# Patient Record
Sex: Male | Born: 1965
Health system: Southern US, Community
[De-identification: ages and names within clinical notes are randomized; demographics above are authoritative.]

## PROBLEM LIST (undated history)

## (undated) ENCOUNTER — Emergency Department (HOSPITAL_COMMUNITY): Admission: EM | Payer: No Typology Code available for payment source | Source: Home / Self Care

## (undated) DIAGNOSIS — E7800 Pure hypercholesterolemia, unspecified: Secondary | ICD-10-CM

## (undated) DIAGNOSIS — K219 Gastro-esophageal reflux disease without esophagitis: Secondary | ICD-10-CM

## (undated) DIAGNOSIS — I10 Essential (primary) hypertension: Secondary | ICD-10-CM

## (undated) DIAGNOSIS — I213 ST elevation (STEMI) myocardial infarction of unspecified site: Secondary | ICD-10-CM

## (undated) DIAGNOSIS — G4730 Sleep apnea, unspecified: Secondary | ICD-10-CM

## (undated) DIAGNOSIS — E119 Type 2 diabetes mellitus without complications: Secondary | ICD-10-CM

## (undated) DIAGNOSIS — I2510 Atherosclerotic heart disease of native coronary artery without angina pectoris: Secondary | ICD-10-CM

## (undated) DIAGNOSIS — I639 Cerebral infarction, unspecified: Secondary | ICD-10-CM

## (undated) DIAGNOSIS — M1990 Unspecified osteoarthritis, unspecified site: Secondary | ICD-10-CM

## (undated) HISTORY — PX: INGUINAL HERNIA REPAIR: SUR1180

## (undated) HISTORY — PX: KNEE ARTHROSCOPY: SHX127

---

## 2005-11-18 ENCOUNTER — Ambulatory Visit (HOSPITAL_COMMUNITY): Admission: RE | Admit: 2005-11-18 | Discharge: 2005-11-18 | Payer: Self-pay | Admitting: Family Medicine

## 2015-08-11 DIAGNOSIS — I10 Essential (primary) hypertension: Secondary | ICD-10-CM | POA: Diagnosis not present

## 2015-08-11 DIAGNOSIS — E782 Mixed hyperlipidemia: Secondary | ICD-10-CM | POA: Diagnosis not present

## 2015-08-11 DIAGNOSIS — E7800 Pure hypercholesterolemia, unspecified: Secondary | ICD-10-CM | POA: Diagnosis not present

## 2015-08-11 DIAGNOSIS — Z720 Tobacco use: Secondary | ICD-10-CM | POA: Diagnosis not present

## 2015-08-11 DIAGNOSIS — E1165 Type 2 diabetes mellitus with hyperglycemia: Secondary | ICD-10-CM | POA: Diagnosis not present

## 2015-09-24 DIAGNOSIS — J0101 Acute recurrent maxillary sinusitis: Secondary | ICD-10-CM | POA: Diagnosis not present

## 2015-09-24 DIAGNOSIS — R05 Cough: Secondary | ICD-10-CM | POA: Diagnosis not present

## 2015-09-24 DIAGNOSIS — Z6829 Body mass index (BMI) 29.0-29.9, adult: Secondary | ICD-10-CM | POA: Diagnosis not present

## 2015-10-31 DIAGNOSIS — Z6829 Body mass index (BMI) 29.0-29.9, adult: Secondary | ICD-10-CM | POA: Diagnosis not present

## 2015-10-31 DIAGNOSIS — L03314 Cellulitis of groin: Secondary | ICD-10-CM | POA: Diagnosis not present

## 2015-11-13 DIAGNOSIS — E11319 Type 2 diabetes mellitus with unspecified diabetic retinopathy without macular edema: Secondary | ICD-10-CM | POA: Diagnosis not present

## 2015-12-25 DIAGNOSIS — I10 Essential (primary) hypertension: Secondary | ICD-10-CM | POA: Diagnosis not present

## 2015-12-25 DIAGNOSIS — E1165 Type 2 diabetes mellitus with hyperglycemia: Secondary | ICD-10-CM | POA: Diagnosis not present

## 2015-12-25 DIAGNOSIS — E782 Mixed hyperlipidemia: Secondary | ICD-10-CM | POA: Diagnosis not present

## 2015-12-25 DIAGNOSIS — E7800 Pure hypercholesterolemia, unspecified: Secondary | ICD-10-CM | POA: Diagnosis not present

## 2015-12-30 DIAGNOSIS — E782 Mixed hyperlipidemia: Secondary | ICD-10-CM | POA: Diagnosis not present

## 2015-12-30 DIAGNOSIS — Z23 Encounter for immunization: Secondary | ICD-10-CM | POA: Diagnosis not present

## 2015-12-30 DIAGNOSIS — E1165 Type 2 diabetes mellitus with hyperglycemia: Secondary | ICD-10-CM | POA: Diagnosis not present

## 2015-12-30 DIAGNOSIS — I10 Essential (primary) hypertension: Secondary | ICD-10-CM | POA: Diagnosis not present

## 2015-12-30 DIAGNOSIS — M170 Bilateral primary osteoarthritis of knee: Secondary | ICD-10-CM | POA: Diagnosis not present

## 2016-02-07 DIAGNOSIS — J019 Acute sinusitis, unspecified: Secondary | ICD-10-CM | POA: Diagnosis not present

## 2016-02-07 DIAGNOSIS — Z6829 Body mass index (BMI) 29.0-29.9, adult: Secondary | ICD-10-CM | POA: Diagnosis not present

## 2016-03-02 DIAGNOSIS — Z6829 Body mass index (BMI) 29.0-29.9, adult: Secondary | ICD-10-CM | POA: Diagnosis not present

## 2016-03-02 DIAGNOSIS — J019 Acute sinusitis, unspecified: Secondary | ICD-10-CM | POA: Diagnosis not present

## 2016-03-02 DIAGNOSIS — R05 Cough: Secondary | ICD-10-CM | POA: Diagnosis not present

## 2016-04-28 DIAGNOSIS — E1165 Type 2 diabetes mellitus with hyperglycemia: Secondary | ICD-10-CM | POA: Diagnosis not present

## 2016-04-28 DIAGNOSIS — I10 Essential (primary) hypertension: Secondary | ICD-10-CM | POA: Diagnosis not present

## 2016-04-28 DIAGNOSIS — E782 Mixed hyperlipidemia: Secondary | ICD-10-CM | POA: Diagnosis not present

## 2016-04-28 DIAGNOSIS — E7800 Pure hypercholesterolemia, unspecified: Secondary | ICD-10-CM | POA: Diagnosis not present

## 2016-04-28 DIAGNOSIS — Z720 Tobacco use: Secondary | ICD-10-CM | POA: Diagnosis not present

## 2016-04-29 DIAGNOSIS — L0501 Pilonidal cyst with abscess: Secondary | ICD-10-CM | POA: Diagnosis not present

## 2016-04-29 DIAGNOSIS — Z6829 Body mass index (BMI) 29.0-29.9, adult: Secondary | ICD-10-CM | POA: Diagnosis not present

## 2016-05-03 DIAGNOSIS — M4628 Osteomyelitis of vertebra, sacral and sacrococcygeal region: Secondary | ICD-10-CM | POA: Diagnosis not present

## 2016-05-04 DIAGNOSIS — I10 Essential (primary) hypertension: Secondary | ICD-10-CM | POA: Diagnosis not present

## 2016-05-04 DIAGNOSIS — F17210 Nicotine dependence, cigarettes, uncomplicated: Secondary | ICD-10-CM | POA: Diagnosis not present

## 2016-05-04 DIAGNOSIS — Z8249 Family history of ischemic heart disease and other diseases of the circulatory system: Secondary | ICD-10-CM | POA: Diagnosis not present

## 2016-05-04 DIAGNOSIS — E119 Type 2 diabetes mellitus without complications: Secondary | ICD-10-CM | POA: Diagnosis not present

## 2016-05-04 DIAGNOSIS — Z7984 Long term (current) use of oral hypoglycemic drugs: Secondary | ICD-10-CM | POA: Diagnosis not present

## 2016-05-04 DIAGNOSIS — L03319 Cellulitis of trunk, unspecified: Secondary | ICD-10-CM | POA: Diagnosis not present

## 2016-05-04 DIAGNOSIS — Z833 Family history of diabetes mellitus: Secondary | ICD-10-CM | POA: Diagnosis not present

## 2016-05-04 DIAGNOSIS — Z91030 Bee allergy status: Secondary | ICD-10-CM | POA: Diagnosis not present

## 2016-05-04 DIAGNOSIS — L02212 Cutaneous abscess of back [any part, except buttock]: Secondary | ICD-10-CM | POA: Diagnosis not present

## 2016-05-04 DIAGNOSIS — A1801 Tuberculosis of spine: Secondary | ICD-10-CM | POA: Diagnosis not present

## 2016-05-04 DIAGNOSIS — Z79899 Other long term (current) drug therapy: Secondary | ICD-10-CM | POA: Diagnosis not present

## 2016-05-05 DIAGNOSIS — I10 Essential (primary) hypertension: Secondary | ICD-10-CM | POA: Diagnosis not present

## 2016-05-05 DIAGNOSIS — Z48817 Encounter for surgical aftercare following surgery on the skin and subcutaneous tissue: Secondary | ICD-10-CM | POA: Diagnosis not present

## 2016-05-05 DIAGNOSIS — E782 Mixed hyperlipidemia: Secondary | ICD-10-CM | POA: Diagnosis not present

## 2016-05-05 DIAGNOSIS — L02212 Cutaneous abscess of back [any part, except buttock]: Secondary | ICD-10-CM | POA: Diagnosis not present

## 2016-05-05 DIAGNOSIS — M170 Bilateral primary osteoarthritis of knee: Secondary | ICD-10-CM | POA: Diagnosis not present

## 2016-05-05 DIAGNOSIS — E1165 Type 2 diabetes mellitus with hyperglycemia: Secondary | ICD-10-CM | POA: Diagnosis not present

## 2016-05-17 DIAGNOSIS — L03319 Cellulitis of trunk, unspecified: Secondary | ICD-10-CM | POA: Diagnosis not present

## 2016-10-29 DIAGNOSIS — R51 Headache: Secondary | ICD-10-CM | POA: Diagnosis not present

## 2016-10-29 DIAGNOSIS — E1165 Type 2 diabetes mellitus with hyperglycemia: Secondary | ICD-10-CM | POA: Diagnosis not present

## 2016-10-29 DIAGNOSIS — H81319 Aural vertigo, unspecified ear: Secondary | ICD-10-CM | POA: Diagnosis not present

## 2016-10-29 DIAGNOSIS — I10 Essential (primary) hypertension: Secondary | ICD-10-CM | POA: Diagnosis not present

## 2017-04-30 DIAGNOSIS — Z6827 Body mass index (BMI) 27.0-27.9, adult: Secondary | ICD-10-CM | POA: Diagnosis not present

## 2017-04-30 DIAGNOSIS — H81313 Aural vertigo, bilateral: Secondary | ICD-10-CM | POA: Diagnosis not present

## 2017-04-30 DIAGNOSIS — J069 Acute upper respiratory infection, unspecified: Secondary | ICD-10-CM | POA: Diagnosis not present

## 2017-04-30 DIAGNOSIS — J0101 Acute recurrent maxillary sinusitis: Secondary | ICD-10-CM | POA: Diagnosis not present

## 2017-07-29 ENCOUNTER — Other Ambulatory Visit: Payer: Self-pay | Admitting: Interventional Cardiology

## 2017-07-29 DIAGNOSIS — Z7984 Long term (current) use of oral hypoglycemic drugs: Secondary | ICD-10-CM | POA: Diagnosis not present

## 2017-07-29 DIAGNOSIS — E119 Type 2 diabetes mellitus without complications: Secondary | ICD-10-CM | POA: Diagnosis not present

## 2017-07-29 DIAGNOSIS — M1990 Unspecified osteoarthritis, unspecified site: Secondary | ICD-10-CM | POA: Diagnosis not present

## 2017-07-29 DIAGNOSIS — R079 Chest pain, unspecified: Secondary | ICD-10-CM | POA: Diagnosis not present

## 2017-07-29 DIAGNOSIS — I10 Essential (primary) hypertension: Secondary | ICD-10-CM | POA: Diagnosis not present

## 2017-07-29 DIAGNOSIS — I213 ST elevation (STEMI) myocardial infarction of unspecified site: Secondary | ICD-10-CM | POA: Diagnosis not present

## 2017-07-29 DIAGNOSIS — Z79899 Other long term (current) drug therapy: Secondary | ICD-10-CM | POA: Diagnosis not present

## 2017-07-29 NOTE — Progress Notes (Unsigned)
Cardiology Admission History and Physical:   Patient ID: Matthew Walker; MRN: [id number]; DOB: 26-Aug-1965   Admission date: (Not on file)  Primary Care Provider: No primary care provider on file. Primary Cardiologist: No primary care provider on file. Matthew Walker (new) Primary Electrophysiologist:  None  Chief Complaint:  Anterior STEMI  Patient Profile:   Matthew Walker is a 52 y.o. male with a history of chest pain less than 2 hours in duration diagnosis anterior ST elevation [facility].  History of Present Illness:   Mr. Matthew Walker ***   No past medical history on file.  *** The histories are not reviewed yet. Please review them in the "History" navigator section and refresh this SmartLink.   Medications Prior to Admission: Prior to Admission medications   Not on File     Allergies:   Allergies not on file  Social History:   Social History   Socioeconomic History  . Marital status: Married    Spouse name: Not on file  . Number of children: Not on file  . Years of education: Not on file  . Highest education level: Not on file  Occupational History  . Not on file  Social Needs  . Financial resource strain: Not on file  . Food insecurity:    Worry: Not on file    Inability: Not on file  . Transportation needs:    Medical: Not on file    Non-medical: Not on file  Tobacco Use  . Smoking status: Not on file  Substance and Sexual Activity  . Alcohol use: Not on file  . Drug use: Not on file  . Sexual activity: Not on file  Lifestyle  . Physical activity:    Days per week: Not on file    Minutes per session: Not on file  . Stress: Not on file  Relationships  . Social connections:    Talks on phone: Not on file    Gets together: Not on file    Attends religious service: Not on file    Active member of club or organization: Not on file    Attends meetings of clubs or organizations: Not on file    Relationship status: Not on file  . Intimate partner  violence:    Fear of current or ex partner: Not on file    Emotionally abused: Not on file    Physically abused: Not on file    Forced sexual activity: Not on file  Other Topics Concern  . Not on file  Social History Narrative  . Not on file    Family History:  *** The patient's family history is not on file.    ROS:  Please see the history of present illness.  ***All other ROS reviewed and negative.     Physical Exam/Data:  There were no vitals filed for this visit. @IOBRIEF @ There were no vitals filed for this visit. There is no height or weight on file to calculate BMI.  General:  Well nourished, well developed, in no acute distress*** HEENT: normal Lymph: no adenopathy Neck: no*** JVD Endocrine:  No thryomegaly Vascular: No carotid bruits; FA pulses 2+ bilaterally without bruits  Cardiac:  normal S1, S2; RRR; no murmur *** Lungs:  clear to auscultation bilaterally, no wheezing, rhonchi or rales  Abd: soft, nontender, no hepatomegaly  Ext: no*** edema Musculoskeletal:  No deformities, BUE and BLE strength normal and equal Skin: warm and dry  Neuro:  CNs 2-12 intact,  no focal abnormalities noted Psych:  Normal affect    EKG:  The ECG that was done *** was personally reviewed and demonstrates ***  Relevant CV Studies: ***  Laboratory Data:  ChemistryNo results for input(s): NA, K, CL, CO2, GLUCOSE, BUN, CREATININE, CALCIUM, GFRNONAA, GFRAA, ANIONGAP in the last 168 hours.  No results for input(s): PROT, ALBUMIN, AST, ALT, ALKPHOS, BILITOT in the last 168 hours. HematologyNo results for input(s): WBC, RBC, HGB, HCT, MCV, MCH, MCHC, RDW, PLT in the last 168 hours. Cardiac EnzymesNo results for input(s): TROPONINI in the last 168 hours. No results for input(s): TROPIPOC in the last 168 hours.  BNPNo results for input(s): BNP, PROBNP in the last 168 hours.  DDimer No results for input(s): DDIMER in the last 168 hours.  Radiology/Studies:  No results  found.  Assessment and Plan:   1. ***  Severity of Illness: {Observation/Inpatient:21159}   For questions or updates, please contact [facility] Please consult [email or web address] for contact info under Cardiology/STEMI.    Signed, Matthew Noe, MD  07/29/2017 11:51 PM

## 2017-07-30 ENCOUNTER — Encounter (HOSPITAL_COMMUNITY): Payer: Self-pay

## 2017-07-30 ENCOUNTER — Inpatient Hospital Stay (HOSPITAL_COMMUNITY)
Admission: RE | Admit: 2017-07-30 | Discharge: 2017-08-02 | DRG: 246 | Disposition: A | Discharge status: Home / Self Care | Payer: Federal, State, Local not specified - PPO | Source: Ambulatory Visit | Attending: Interventional Cardiology | Admitting: Interventional Cardiology

## 2017-07-30 ENCOUNTER — Other Ambulatory Visit: Payer: Self-pay

## 2017-07-30 ENCOUNTER — Encounter (HOSPITAL_COMMUNITY)
Admission: RE | Disposition: A | Discharge status: Home / Self Care | Payer: Self-pay | Source: Ambulatory Visit | Attending: Interventional Cardiology

## 2017-07-30 DIAGNOSIS — I10 Essential (primary) hypertension: Secondary | ICD-10-CM

## 2017-07-30 DIAGNOSIS — F17200 Nicotine dependence, unspecified, uncomplicated: Secondary | ICD-10-CM | POA: Diagnosis present

## 2017-07-30 DIAGNOSIS — I2101 ST elevation (STEMI) myocardial infarction involving left main coronary artery: Secondary | ICD-10-CM | POA: Diagnosis not present

## 2017-07-30 DIAGNOSIS — Z955 Presence of coronary angioplasty implant and graft: Secondary | ICD-10-CM

## 2017-07-30 DIAGNOSIS — Z9861 Coronary angioplasty status: Secondary | ICD-10-CM

## 2017-07-30 DIAGNOSIS — E785 Hyperlipidemia, unspecified: Secondary | ICD-10-CM | POA: Diagnosis not present

## 2017-07-30 DIAGNOSIS — I255 Ischemic cardiomyopathy: Secondary | ICD-10-CM | POA: Diagnosis present

## 2017-07-30 DIAGNOSIS — I2510 Atherosclerotic heart disease of native coronary artery without angina pectoris: Secondary | ICD-10-CM | POA: Diagnosis not present

## 2017-07-30 DIAGNOSIS — I498 Other specified cardiac arrhythmias: Secondary | ICD-10-CM | POA: Diagnosis present

## 2017-07-30 DIAGNOSIS — Z720 Tobacco use: Secondary | ICD-10-CM | POA: Diagnosis not present

## 2017-07-30 DIAGNOSIS — E1165 Type 2 diabetes mellitus with hyperglycemia: Secondary | ICD-10-CM | POA: Diagnosis present

## 2017-07-30 DIAGNOSIS — I213 ST elevation (STEMI) myocardial infarction of unspecified site: Secondary | ICD-10-CM

## 2017-07-30 DIAGNOSIS — Z91030 Bee allergy status: Secondary | ICD-10-CM | POA: Diagnosis not present

## 2017-07-30 DIAGNOSIS — E118 Type 2 diabetes mellitus with unspecified complications: Secondary | ICD-10-CM | POA: Diagnosis not present

## 2017-07-30 DIAGNOSIS — R079 Chest pain, unspecified: Secondary | ICD-10-CM | POA: Diagnosis present

## 2017-07-30 DIAGNOSIS — I25119 Atherosclerotic heart disease of native coronary artery with unspecified angina pectoris: Secondary | ICD-10-CM | POA: Diagnosis not present

## 2017-07-30 DIAGNOSIS — E876 Hypokalemia: Secondary | ICD-10-CM | POA: Diagnosis not present

## 2017-07-30 DIAGNOSIS — Z91048 Other nonmedicinal substance allergy status: Secondary | ICD-10-CM

## 2017-07-30 DIAGNOSIS — I472 Ventricular tachycardia: Secondary | ICD-10-CM | POA: Diagnosis not present

## 2017-07-30 DIAGNOSIS — E119 Type 2 diabetes mellitus without complications: Secondary | ICD-10-CM | POA: Insufficient documentation

## 2017-07-30 DIAGNOSIS — E1159 Type 2 diabetes mellitus with other circulatory complications: Secondary | ICD-10-CM | POA: Diagnosis not present

## 2017-07-30 DIAGNOSIS — I2102 ST elevation (STEMI) myocardial infarction involving left anterior descending coronary artery: Secondary | ICD-10-CM

## 2017-07-30 DIAGNOSIS — I252 Old myocardial infarction: Secondary | ICD-10-CM | POA: Diagnosis present

## 2017-07-30 DIAGNOSIS — Z23 Encounter for immunization: Secondary | ICD-10-CM

## 2017-07-30 HISTORY — DX: ST elevation (STEMI) myocardial infarction of unspecified site: I21.3

## 2017-07-30 HISTORY — DX: Pure hypercholesterolemia, unspecified: E78.00

## 2017-07-30 HISTORY — PX: CORONARY/GRAFT ACUTE MI REVASCULARIZATION: CATH118305

## 2017-07-30 HISTORY — DX: Type 2 diabetes mellitus without complications: E11.9

## 2017-07-30 HISTORY — PX: CORONARY STENT INTERVENTION: CATH118234

## 2017-07-30 HISTORY — PX: LEFT HEART CATH AND CORONARY ANGIOGRAPHY: CATH118249

## 2017-07-30 HISTORY — DX: Atherosclerotic heart disease of native coronary artery without angina pectoris: I25.10

## 2017-07-30 HISTORY — DX: Essential (primary) hypertension: I10

## 2017-07-30 HISTORY — DX: Unspecified osteoarthritis, unspecified site: M19.90

## 2017-07-30 HISTORY — DX: Gastro-esophageal reflux disease without esophagitis: K21.9

## 2017-07-30 LAB — BASIC METABOLIC PANEL
Anion gap: 13 (ref 5–15)
BUN: 9 mg/dL (ref 6–20)
CO2: 21 mmol/L — ABNORMAL LOW (ref 22–32)
Calcium: 8.7 mg/dL — ABNORMAL LOW (ref 8.9–10.3)
Chloride: 103 mmol/L (ref 101–111)
Creatinine, Ser: 0.63 mg/dL (ref 0.61–1.24)
GFR calc Af Amer: >60 mL/min (ref >60)
GFR calc non Af Amer: >60 mL/min (ref >60)
Glucose, Bld: 218 mg/dL — ABNORMAL HIGH (ref 65–99)
Potassium: 3.7 mmol/L (ref 3.5–5.1)
Sodium: 137 mmol/L (ref 135–145)

## 2017-07-30 LAB — HEMOGLOBIN A1C
Hgb A1c MFr Bld: 10.8 % — ABNORMAL HIGH (ref 4.8–5.6)
Hgb A1c MFr Bld: 10.6 % — ABNORMAL HIGH (ref 4.8–5.6)
Mean Plasma Glucose: 263.26 mg/dL
Mean Plasma Glucose: 257.52 mg/dL

## 2017-07-30 LAB — GLUCOSE, CAPILLARY
Glucose-Capillary: 159 mg/dL — ABNORMAL HIGH (ref 65–99)
Glucose-Capillary: 219 mg/dL — ABNORMAL HIGH (ref 65–99)
Glucose-Capillary: 221 mg/dL — ABNORMAL HIGH (ref 65–99)
Glucose-Capillary: 251 mg/dL — ABNORMAL HIGH (ref 65–99)

## 2017-07-30 LAB — COMPREHENSIVE METABOLIC PANEL
ALT: 35 U/L (ref 17–63)
AST: 25 U/L (ref 15–41)
Albumin: 4 g/dL (ref 3.5–5.0)
Alkaline Phosphatase: 72 U/L (ref 38–126)
Anion gap: 14 (ref 5–15)
BUN: 12 mg/dL (ref 6–20)
CO2: 21 mmol/L — ABNORMAL LOW (ref 22–32)
Calcium: 9.2 mg/dL (ref 8.9–10.3)
Chloride: 102 mmol/L (ref 101–111)
Creatinine, Ser: 0.76 mg/dL (ref 0.61–1.24)
GFR calc Af Amer: >60 mL/min (ref >60)
GFR calc non Af Amer: >60 mL/min (ref >60)
Glucose, Bld: 238 mg/dL — ABNORMAL HIGH (ref 65–99)
Potassium: 3.7 mmol/L (ref 3.5–5.1)
Sodium: 137 mmol/L (ref 135–145)
Total Bilirubin: 1.3 mg/dL — ABNORMAL HIGH (ref 0.3–1.2)
Total Protein: 6.2 g/dL — ABNORMAL LOW (ref 6.5–8.1)

## 2017-07-30 LAB — LIPID PANEL
Cholesterol: 168 mg/dL (ref 0–200)
HDL: 38 mg/dL — ABNORMAL LOW (ref >40)
LDL Cholesterol: 119 mg/dL — ABNORMAL HIGH (ref 0–99)
Total CHOL/HDL Ratio: 4.4 RATIO
Triglycerides: 56 mg/dL (ref <150)
VLDL: 11 mg/dL (ref 0–40)

## 2017-07-30 LAB — CBC
HCT: 45.8 % (ref 39.0–52.0)
HCT: 46.8 % (ref 39.0–52.0)
Hemoglobin: 15.7 g/dL (ref 13.0–17.0)
Hemoglobin: 16.1 g/dL (ref 13.0–17.0)
MCH: 30.1 pg (ref 26.0–34.0)
MCH: 30.1 pg (ref 26.0–34.0)
MCHC: 34.3 g/dL (ref 30.0–36.0)
MCHC: 34.4 g/dL (ref 30.0–36.0)
MCV: 87.6 fL (ref 78.0–100.0)
MCV: 87.7 fL (ref 78.0–100.0)
Platelets: 140 10*3/uL — ABNORMAL LOW (ref 150–400)
Platelets: 149 10*3/uL — ABNORMAL LOW (ref 150–400)
RBC: 5.22 MIL/uL (ref 4.22–5.81)
RBC: 5.34 MIL/uL (ref 4.22–5.81)
RDW: 13.3 % (ref 11.5–15.5)
RDW: 13.3 % (ref 11.5–15.5)
WBC: 12.5 10*3/uL — ABNORMAL HIGH (ref 4.0–10.5)
WBC: 9.4 10*3/uL (ref 4.0–10.5)

## 2017-07-30 LAB — TROPONIN I
Troponin I: <0.03 ng/mL (ref <0.03)
Troponin I: 10.08 ng/mL (ref <0.03)
Troponin I: 19.61 ng/mL (ref <0.03)
Troponin I: 11.17 ng/mL (ref <0.03)

## 2017-07-30 LAB — APTT: aPTT: 77 seconds — ABNORMAL HIGH (ref 24–36)

## 2017-07-30 LAB — PROTIME-INR
INR: 1.03
Prothrombin Time: 13.4 seconds (ref 11.4–15.2)

## 2017-07-30 LAB — MRSA PCR SCREENING: MRSA by PCR: NEGATIVE

## 2017-07-30 LAB — POCT ACTIVATED CLOTTING TIME: Activated Clotting Time: 142 seconds

## 2017-07-30 SURGERY — CORONARY/GRAFT ACUTE MI REVASCULARIZATION
Anesthesia: LOCAL

## 2017-07-30 MED ORDER — SODIUM CHLORIDE 0.9 % WEIGHT BASED INFUSION
1.0000 mL/kg/h | INTRAVENOUS | Status: AC
  Administered 2017-07-30: 1 mL/kg/h via INTRAVENOUS

## 2017-07-30 MED ORDER — TIROFIBAN (AGGRASTAT) BOLUS VIA INFUSION
INTRAVENOUS | Status: DC | PRN
  Administered 2017-07-30: 2154.575 ug via INTRAVENOUS

## 2017-07-30 MED ORDER — TIROFIBAN HCL IN NACL 5-0.9 MG/100ML-% IV SOLN
INTRAVENOUS | Status: AC
  Filled 2017-07-30: qty 100

## 2017-07-30 MED ORDER — ACETAMINOPHEN 325 MG PO TABS
650.0000 mg | ORAL_TABLET | ORAL | Status: DC | PRN
  Administered 2017-08-01: 650 mg via ORAL
  Filled 2017-07-30: qty 2

## 2017-07-30 MED ORDER — INSULIN ASPART 100 UNIT/ML ~~LOC~~ SOLN
0.0000 [IU] | Freq: Three times a day (TID) | SUBCUTANEOUS | Status: DC
  Administered 2017-07-30: 3 [IU] via SUBCUTANEOUS
  Administered 2017-07-30: 2 [IU] via SUBCUTANEOUS
  Administered 2017-07-30 – 2017-07-31 (×2): 3 [IU] via SUBCUTANEOUS
  Administered 2017-07-31: 2 [IU] via SUBCUTANEOUS
  Administered 2017-07-31: 3 [IU] via SUBCUTANEOUS
  Administered 2017-08-01: 18:00:00 5 [IU] via SUBCUTANEOUS
  Administered 2017-08-02 (×2): 3 [IU] via SUBCUTANEOUS

## 2017-07-30 MED ORDER — HEPARIN SODIUM (PORCINE) 5000 UNIT/ML IJ SOLN
5000.0000 [IU] | Freq: Three times a day (TID) | INTRAMUSCULAR | Status: DC
  Administered 2017-07-30 – 2017-08-02 (×6): 5000 [IU] via SUBCUTANEOUS
  Filled 2017-07-30 (×6): qty 1

## 2017-07-30 MED ORDER — NITROGLYCERIN IN D5W 200-5 MCG/ML-% IV SOLN
10.0000 ug/min | INTRAVENOUS | Status: DC
  Filled 2017-07-30: qty 250

## 2017-07-30 MED ORDER — IOHEXOL 350 MG/ML SOLN
INTRAVENOUS | Status: DC | PRN
  Administered 2017-07-30: 185 mL

## 2017-07-30 MED ORDER — SODIUM CHLORIDE 0.9 % IV SOLN: 250.0000 mL | INTRAVENOUS | Status: DC | PRN

## 2017-07-30 MED ORDER — ATORVASTATIN CALCIUM 80 MG PO TABS
80.0000 mg | ORAL_TABLET | Freq: Every day | ORAL | Status: DC
  Administered 2017-07-30 – 2017-08-01 (×3): 80 mg via ORAL
  Filled 2017-07-30 (×3): qty 1

## 2017-07-30 MED ORDER — IOPAMIDOL (ISOVUE-370) INJECTION 76%
INTRAVENOUS | Status: DC | PRN
  Administered 2017-07-30: 110 mL

## 2017-07-30 MED ORDER — METOPROLOL TARTRATE 25 MG PO TABS: 25.0000 mg | ORAL_TABLET | Freq: Two times a day (BID) | ORAL | Status: DC

## 2017-07-30 MED ORDER — LABETALOL HCL 5 MG/ML IV SOLN: 10.0000 mg | INTRAVENOUS | Status: AC | PRN

## 2017-07-30 MED ORDER — OXYCODONE HCL 5 MG PO TABS: 5.0000 mg | ORAL_TABLET | ORAL | Status: DC | PRN

## 2017-07-30 MED ORDER — NITROGLYCERIN 1 MG/10 ML FOR IR/CATH LAB
INTRA_ARTERIAL | Status: DC | PRN
  Administered 2017-07-30 (×2): 200 ug via INTRACORONARY

## 2017-07-30 MED ORDER — METOPROLOL TARTRATE 12.5 MG HALF TABLET
12.5000 mg | ORAL_TABLET | Freq: Two times a day (BID) | ORAL | Status: DC
  Administered 2017-07-30: 12.5 mg via ORAL
  Filled 2017-07-30: qty 1

## 2017-07-30 MED ORDER — MIDAZOLAM HCL 2 MG/2ML IJ SOLN
INTRAMUSCULAR | Status: DC | PRN
  Administered 2017-07-30: 1 mg via INTRAVENOUS

## 2017-07-30 MED ORDER — TIROFIBAN HCL IN NACL 5-0.9 MG/100ML-% IV SOLN
INTRAVENOUS | Status: AC | PRN
  Administered 2017-07-30: 0.15 ug/kg/min via INTRAVENOUS

## 2017-07-30 MED ORDER — PNEUMOCOCCAL VAC POLYVALENT 25 MCG/0.5ML IJ INJ
0.5000 mL | INJECTION | INTRAMUSCULAR | Status: AC
  Administered 2017-08-02: 10:00:00 0.5 mL via INTRAMUSCULAR
  Filled 2017-07-30: qty 0.5

## 2017-07-30 MED ORDER — FENTANYL CITRATE (PF) 100 MCG/2ML IJ SOLN
INTRAMUSCULAR | Status: AC
  Filled 2017-07-30: qty 2

## 2017-07-30 MED ORDER — ASPIRIN 81 MG PO CHEW
81.0000 mg | CHEWABLE_TABLET | Freq: Every day | ORAL | Status: DC
  Administered 2017-07-30 – 2017-07-31 (×2): 81 mg via ORAL
  Filled 2017-07-30 (×2): qty 1

## 2017-07-30 MED ORDER — LIDOCAINE HCL (PF) 1 % IJ SOLN
INTRAMUSCULAR | Status: AC
  Filled 2017-07-30: qty 30

## 2017-07-30 MED ORDER — VERAPAMIL HCL 2.5 MG/ML IV SOLN
INTRAVENOUS | Status: DC | PRN
  Administered 2017-07-30: 10 mL via INTRA_ARTERIAL

## 2017-07-30 MED ORDER — HEPARIN SODIUM (PORCINE) 1000 UNIT/ML IJ SOLN
INTRAMUSCULAR | Status: AC
  Filled 2017-07-30: qty 1

## 2017-07-30 MED ORDER — METOPROLOL TARTRATE 12.5 MG HALF TABLET
12.5000 mg | ORAL_TABLET | Freq: Once | ORAL | Status: AC
  Administered 2017-07-30: 12.5 mg via ORAL
  Filled 2017-07-30: qty 1

## 2017-07-30 MED ORDER — HEPARIN SODIUM (PORCINE) 1000 UNIT/ML IJ SOLN
INTRAMUSCULAR | Status: DC | PRN
  Administered 2017-07-30: 8000 [IU] via INTRAVENOUS

## 2017-07-30 MED ORDER — HEPARIN (PORCINE) IN NACL 2-0.9 UNIT/ML-% IJ SOLN
INTRAMUSCULAR | Status: AC | PRN
  Administered 2017-07-30 (×2): 500 mL

## 2017-07-30 MED ORDER — MIDAZOLAM HCL 2 MG/2ML IJ SOLN
INTRAMUSCULAR | Status: AC
  Filled 2017-07-30: qty 2

## 2017-07-30 MED ORDER — ALPRAZOLAM 0.25 MG PO TABS
0.2500 mg | ORAL_TABLET | Freq: Two times a day (BID) | ORAL | Status: DC | PRN
Start: 2017-07-30 — End: 2017-08-02

## 2017-07-30 MED ORDER — METOPROLOL TARTRATE 25 MG PO TABS
25.0000 mg | ORAL_TABLET | Freq: Two times a day (BID) | ORAL | Status: DC
  Filled 2017-07-30: qty 1

## 2017-07-30 MED ORDER — SODIUM CHLORIDE 0.9% FLUSH
3.0000 mL | Freq: Two times a day (BID) | INTRAVENOUS | Status: DC
  Administered 2017-07-30 (×2): 3 mL via INTRAVENOUS

## 2017-07-30 MED ORDER — HYDRALAZINE HCL 20 MG/ML IJ SOLN: 5.0000 mg | INTRAMUSCULAR | Status: AC | PRN

## 2017-07-30 MED ORDER — CLOPIDOGREL BISULFATE 75 MG PO TABS
75.0000 mg | ORAL_TABLET | Freq: Every day | ORAL | Status: DC
  Administered 2017-07-30 – 2017-07-31 (×2): 75 mg via ORAL
  Filled 2017-07-30 (×2): qty 1

## 2017-07-30 MED ORDER — SODIUM CHLORIDE 0.9% FLUSH: 3.0000 mL | INTRAVENOUS | Status: DC | PRN

## 2017-07-30 MED ORDER — ONDANSETRON HCL 4 MG/2ML IJ SOLN: 4.0000 mg | Freq: Four times a day (QID) | INTRAMUSCULAR | Status: DC | PRN

## 2017-07-30 MED ORDER — FENTANYL CITRATE (PF) 100 MCG/2ML IJ SOLN
INTRAMUSCULAR | Status: DC | PRN
  Administered 2017-07-30: 50 ug via INTRAVENOUS

## 2017-07-30 MED ORDER — VERAPAMIL HCL 2.5 MG/ML IV SOLN
INTRAVENOUS | Status: AC
  Filled 2017-07-30: qty 2

## 2017-07-30 MED ORDER — TIROFIBAN HCL IN NACL 5-0.9 MG/100ML-% IV SOLN
0.1500 ug/kg/min | INTRAVENOUS | Status: AC
  Administered 2017-07-30 (×3): 0.15 ug/kg/min via INTRAVENOUS
  Filled 2017-07-30 (×3): qty 100

## 2017-07-30 MED ORDER — NITROGLYCERIN 1 MG/10 ML FOR IR/CATH LAB
INTRA_ARTERIAL | Status: AC
  Filled 2017-07-30: qty 10

## 2017-07-30 MED ORDER — HEPARIN (PORCINE) IN NACL 2-0.9 UNIT/ML-% IJ SOLN
INTRAMUSCULAR | Status: AC
  Filled 2017-07-30: qty 1500

## 2017-07-30 MED ORDER — LIDOCAINE HCL (PF) 1 % IJ SOLN
INTRAMUSCULAR | Status: DC | PRN
  Administered 2017-07-30: 2 mL
  Administered 2017-07-30: 15 mL

## 2017-07-30 SURGICAL SUPPLY — 26 items
BALLN SAPPHIRE 2.5X12 (BALLOONS) ×2
BALLOON SAPPHIRE 2.5X12 (BALLOONS) ×1 IMPLANT
BAND ZEPHYR COMPRESS 30 LONG (HEMOSTASIS) ×2 IMPLANT
CATH 5FR JL3.5 JR4 ANG PIG MP (CATHETERS) ×2 IMPLANT
CATH INFINITI 5 FR AR1 MOD (CATHETERS) ×2 IMPLANT
CATH LAUNCHER 5F AR1 (CATHETERS) ×2 IMPLANT
CATH LAUNCHER 5F RADR (CATHETERS) ×1 IMPLANT
CATH VISTA GUIDE 6FR XBLAD3.5 (CATHETERS) ×2 IMPLANT
CATH VISTA GUIDE 6FR XBLAD4 (CATHETERS) ×2 IMPLANT
CATH VISTA GUIDE 6FR XBRCA (CATHETERS) ×2 IMPLANT
CATHETER LAUNCHER 5F RADR (CATHETERS) ×2
COVER PRB 48X5XTLSCP FOLD TPE (BAG) ×1 IMPLANT
COVER PROBE 5X48 (BAG) ×1
ELECT DEFIB PAD ADLT CADENCE (PAD) ×2 IMPLANT
GLIDESHEATH SLEND A-KIT 6F 22G (SHEATH) ×2 IMPLANT
GUIDEWIRE INQWIRE 1.5J.035X260 (WIRE) ×1 IMPLANT
INQWIRE 1.5J .035X260CM (WIRE) ×2
KIT ENCORE 26 ADVANTAGE (KITS) ×4 IMPLANT
KIT HEART LEFT (KITS) ×2 IMPLANT
PACK CARDIAC CATHETERIZATION (CUSTOM PROCEDURE TRAY) ×2 IMPLANT
SHEATH AVANTI 11CM 6FR (SHEATH) ×2 IMPLANT
STENT SYNERGY DES 2.75X28 (Permanent Stent) ×2 IMPLANT
TRANSDUCER W/STOPCOCK (MISCELLANEOUS) ×2 IMPLANT
TUBING CIL FLEX 10 FLL-RA (TUBING) ×2 IMPLANT
WIRE ASAHI PROWATER 180CM (WIRE) ×2 IMPLANT
WIRE EMERALD 3MM-J .035X150CM (WIRE) ×2 IMPLANT

## 2017-07-30 NOTE — H&P (Addendum)
CARDIOLOGY H&P  The patient has been seen in conjunction with Willeen Cass, MD. All aspects of care have been considered and discussed. The patient has been personally interviewed, examined, and all clinical data has been reviewed.   Sudden onset of severe chest pain at ~ 9 PM--> ER at 11PM --> STEMI activation and transfer from [facility].  Reported significant reduction in chest pain since onset at 9 PM.   Met in ambulance bay, assessment noting good skin color, no diaphoresis, clear lung fields, symmetrical pulses upper and lower extremities, and no murmur/rub.  Neck veins were flat.  ECG demonstrated Q waves V1 through  V4 as well as inferior leads II, III, and aVF. Suggesting of ongoing ischemia with precordial hyperacute T waves V1 through 4.   Despite feeling substantially better, emergency catheterization was felt to be indicated to define anatomy and help guide therapy.  The procedure and anticipated risk profile was discussed with the patient who agreed to proceed with evaluation and treatment with percutaneous intervention if appropriate.  Critical care time 35 minutes.   HPI:   Patient is a 52 y/o M who was transferred from [facility] for chest pain that presumably started an hr prior to his arrival to their ER at 9:30pm. Pain continued during the ER visit and improved with the administration nitro paste. Pain continued across his shoulder and into his jaw. On arrival an EKG obtained which showed ST changes in the anterolateral leads and hence they transferred the patient to [facility] for further intervention. This is the first time he's had pain like this. He had associated nausea, sob, but denies any hx of pnd, orthopnea or lower extremity swelling. Patient did not have any labs from the OSH, but was sent here on a heparin gtt and nitro paste was applied. He was taken directly to the cath lab at which time he was still complaining of a 3/10 chest  pain. For this reason we decided to proceed with a diagnostic cath and if needed further intervention. Patient has no prior hx of CAD, but does have HTN, Type 2 DM, HLP and hx of smoking. Currently still smokes about a ppd, denies drug use. He works as a Physiological scientist and participates in Psychologist, forensic.  Review of Systems:     Cardiac Review of Systems: {Y] = yes '[ ]'$  = no  Chest Pain [ x   ]  Resting SOB [ x  ] Exertional SOB  [ x ]  Orthopnea [  ]   Pedal Edema [   ]    Palpitations [  ] Syncope  [  ]   Presyncope [   ]  General Review of Systems: [Y] = yes [  ]=no Constitional: recent weight change [  ]; anorexia [  ]; fatigue [  ]; nausea [  ]; night sweats [  ]; fever [  ]; or chills [  ];                                    Dental: poor dentition[  ];   Eye : blurred vision [  ]; diplopia [   ]; vision changes [  ];  Amaurosis fugax[  ]; Resp: cough [  ];  wheezing[  ];  hemoptysis[  ]; shortness of breath[ x ]; paroxysmal nocturnal dyspnea[  ]; dyspnea on exertion[  ]; or orthopnea[  ];  GI:  gallstones[  ], vomiting[  ];  dysphagia[  ]; melena[  ];  hematochezia [  ]; heartburn[  ];   GU: kidney stones [  ]; hematuria[  ];   dysuria [  ];  nocturia[  ];               Skin: rash [  ], swelling[  ];, hair loss[  ];  peripheral edema[  ];  or itching[  ]; Musculosketetal: myalgias[  ];  joint swelling[  ];  joint erythema[  ];  joint pain[  ];  back pain[  ];  Heme/Lymph: bruising[  ];  bleeding[  ];  anemia[  ];  Neuro: TIA[  ];  headaches[  ];  stroke[  ];  vertigo[  ];  seizures[  ];   paresthesias[  ];  difficulty walking[  ];  Psych:depression[  ]; anxiety[  ];  Endocrine: diabetes[  ];  thyroid dysfunction[  ];  Other:  No past medical history on file.  Losartan, Amlodipine, will let pharmacy confirm  Not on File  Social History   Socioeconomic History  . Marital status: Married    Spouse name: Not on file  . Number of children: Not on file  . Years of education:  Not on file  . Highest education level: Not on file  Occupational History  . Not on file  Social Needs  . Financial resource strain: Not on file  . Food insecurity:    Worry: Not on file    Inability: Not on file  . Transportation needs:    Medical: Not on file    Non-medical: Not on file  Tobacco Use  . Smoking status: Not on file  Substance and Sexual Activity  . Alcohol use: Not on file  . Drug use: Not on file  . Sexual activity: Not on file  Lifestyle  . Physical activity:    Days per week: Not on file    Minutes per session: Not on file  . Stress: Not on file  Relationships  . Social connections:    Talks on phone: Not on file    Gets together: Not on file    Attends religious service: Not on file    Active member of club or organization: Not on file    Attends meetings of clubs or organizations: Not on file    Relationship status: Not on file  . Intimate partner violence:    Fear of current or ex partner: Not on file    Emotionally abused: Not on file    Physically abused: Not on file    Forced sexual activity: Not on file  Other Topics Concern  . Not on file  Social History Narrative  . Not on file   No family history on file.  PHYSICAL EXAM: Vitals:   07/30/17 0020  SpO2: 100%   General:  Well appearing. No respiratory difficulty. Minimal distress 2/2 to chest pain.  HEENT: normal Neck: supple. no JVD. No lymphadenopathy or thryomegaly appreciated. Cor: PMI nondisplaced. Regular rate & rhythm. No rubs, gallops or murmurs. Lungs: clear Abdomen: soft, nontender, nondistended. No hepatosplenomegaly. No bruits or masses. Good bowel sounds. Extremities: no cyanosis, clubbing, rash, edema Neuro: alert & oriented x 3, cranial nerves grossly intact. moves all 4 extremities w/o difficulty. Affect pleasant.  ECG:  Results for orders placed or performed during the hospital encounter of 07/30/17 (from the past 24 hour(s))  CBC     Status: Abnormal  Collection Time: 07/30/17 12:34 AM  Result Value Ref Range   WBC 12.5 (H) 4.0 - 10.5 K/uL   RBC 5.34 4.22 - 5.81 MIL/uL   Hemoglobin 16.1 13.0 - 17.0 g/dL   HCT 46.8 39.0 - 52.0 %   MCV 87.6 78.0 - 100.0 fL   MCH 30.1 26.0 - 34.0 pg   MCHC 34.4 30.0 - 36.0 g/dL   RDW 13.3 11.5 - 15.5 %   Platelets 140 (L) 150 - 400 K/uL  Protime-INR     Status: None   Collection Time: 07/30/17 12:34 AM  Result Value Ref Range   Prothrombin Time 13.4 11.4 - 15.2 seconds   INR 1.03   APTT     Status: Abnormal   Collection Time: 07/30/17 12:34 AM  Result Value Ref Range   aPTT 77 (H) 24 - 36 seconds   No results found.  ASSESSMENT:  Anterolateral up sloping ST elevations, Acute mLAD lesion with residual disease. HTN, Type 2 DM Smoker  PLAN/DISCUSSION:  Intervention with DES to the LAD. Patient will be continue on DAPT therapy. There is resolution of the pain after obtaining flow down the LAD.  Will obtain an echo in the AM. Will also need to check TSH, A1c, Lipid panel and discuss with him about stopping smoking.  Will restart home medications and antihypertensive depending on his hemodynamics.  Ok to give lopressor 12.'5mg'$ , BID if SBP > 90. Will initiate high intensity statin therapy as well. Needs aggressive secondary risk factor modification given the residual CAD and further intervention may be required via staged PCI on the RCA and LCx vessels.

## 2017-07-30 NOTE — Progress Notes (Signed)
Right femoral sheath pulled at 0557.  Pressure held for 20 minutes. Vitals stable throughout. Pressure dressing applied. Femoral site is a level 0. Will continue to monitor.

## 2017-07-30 NOTE — Plan of Care (Signed)
  Problem: Elimination: Goal: Will not experience complications related to urinary retention Outcome: Progressing Note:  Pt is making adequate urine output.    Problem: Pain Managment: Goal: General experience of comfort will improve Outcome: Progressing Note:  Pt is not experiencing any pain.    Problem: Safety: Goal: Ability to remain free from injury will improve Outcome: Progressing   Problem: Skin Integrity: Goal: Risk for impaired skin integrity will decrease Outcome: Progressing   Problem: Cardiac: Goal: Vascular access site(s) Level 0-1 will be maintained Outcome: Progressing Note:  Both femoral sites and radial sites have remained a level zero

## 2017-07-30 NOTE — Progress Notes (Signed)
Progress Note  Patient Name: Matthew Walker Date of Encounter: 07/30/2017  Primary Cardiologist: New  Subjective   Feeling well this morning.  Troponin has peaked at 10.  No further chest pain.  Inpatient Medications    Scheduled Meds: . aspirin  81 mg Oral Daily  . clopidogrel  75 mg Oral Q breakfast  . heparin  5,000 Units Subcutaneous Q8H  . insulin aspart  0-9 Units Subcutaneous TID WC  . metoprolol tartrate  12.5 mg Oral BID  . sodium chloride flush  3 mL Intravenous Q12H   Continuous Infusions: . sodium chloride    . sodium chloride 1 mL/kg/hr (07/30/17 0800)  . nitroGLYCERIN    . tirofiban 0.15 mcg/kg/min (07/30/17 0800)   PRN Meds: sodium chloride, acetaminophen, ALPRAZolam, ondansetron (ZOFRAN) IV, oxyCODONE, sodium chloride flush   Vital Signs    Vitals:   07/30/17 0645 07/30/17 0700 07/30/17 0738 07/30/17 0800  BP: (!) 144/105 (!) 149/104  (!) 147/110  Pulse: 73 60  68  Resp: 14 14  12   Temp:   98.1 F (36.7 C)   TempSrc:   Tympanic   SpO2: 97% 96%  97%  Weight:      Height:        Intake/Output Summary (Last 24 hours) at 07/30/2017 0914 Last data filed at 07/30/2017 0800 Gross per 24 hour  Intake 166.78 ml  Output 1500 ml  Net -1333.22 ml   Filed Weights   07/30/17 0200  Weight: 182 lb 8.7 oz (82.8 kg)    Telemetry    Sinus rhythm with nonsustained VT up to 5 beats- Personally Reviewed  ECG    Sinus rhythm, anterior and inferior Q waves- Personally Reviewed  Physical Exam   GEN: No acute distress.   Neck: No JVD Cardiac: RRR, no murmurs, rubs, or gallops.  Respiratory: Clear to auscultation bilaterally. GI: Soft, nontender, non-distended  MS: No edema; No deformity. Neuro:  Nonfocal  Psych: Normal affect   Labs    Chemistry Recent Labs  Lab 07/30/17 0034 07/30/17 0354  NA 137 137  K 3.7 3.7  CL 102 103  CO2 21* 21*  GLUCOSE 238* 218*  BUN 12 9  CREATININE 0.76 0.63  CALCIUM 9.2 8.7*  PROT 6.2*  --   ALBUMIN 4.0  --     AST 25  --   ALT 35  --   ALKPHOS 72  --   BILITOT 1.3*  --   GFRNONAA >60 >60  GFRAA >60 >60  ANIONGAP 14 13     Hematology Recent Labs  Lab 07/30/17 0034 07/30/17 0354  WBC 12.5* 9.4  RBC 5.34 5.22  HGB 16.1 15.7  HCT 46.8 45.8  MCV 87.6 87.7  MCH 30.1 30.1  MCHC 34.4 34.3  RDW 13.3 13.3  PLT 140* 149*    Cardiac Enzymes Recent Labs  Lab 07/30/17 0034 07/30/17 0354  TROPONINI <0.03 10.08*   No results for input(s): TROPIPOC in the last 168 hours.   BNPNo results for input(s): BNP, PROBNP in the last 168 hours.   DDimer No results for input(s): DDIMER in the last 168 hours.   Radiology    No results found.  Cardiac Studies   LHC  Technically difficult procedure due to tortuosity in the right innominate artery preventing catheter control especially when attempting to engage the right coronary.  This led to crossover to femoral approach.  These difficulties prolong the time to reperfusion.  Severe three-vessel coronary disease with 80-90%  proximal RCA stenosis.  RCA is supplying collaterals to the LAD.  25% distal left main narrowing.  95% stenosis in the proximal to mid circumflex within a tortuous segment.  Total occlusion of the mid LAD collateralized from the right coronary with thrombus staining.  The lesion is felt to represent the culprit for the patient's presentation.  The LAD was successfully treated with angioplasty and stenting using a 28 x 2.75 mm Synergy stent reducing the 100% stenosis to less than 10% with TIMI grade III flow.  Reperfusion arrhythmias and transient increase in chest discomfort was noted.  Normal left ventricular end-diastolic pressure.  Left ventricular function was not assessed.  Patient Profile     52 y.o. male who presented to the hospital complaining of chest pain, found to have acute anterior STEMI.  Assessment & Plan    1.  ST elevation MI: That is post LAD intervention with drug-eluting stent.  Troponin at 10 this  morning the patient is currently chest pain-free.  On dual antiplatelet therapy.  He does have significant disease of his coronaries otherwise, and thus Muneeb Veras likely need future intervention.  This Dystany Duffy be discussed by the interventional team to determine whether or not cath versus bypass surgery is recommended.  He is currently on metoprolol, Johnryan Sao increase that dose to 25 mg twice a day.  He Harwood Nall also likely need medications for blood pressure control in the future.  Echo is pending today.  We Mohd Clemons continue to watch in the ICU throughout the day today and likely transfer to telemetry tomorrow.  2.  Hyperlipidemia: LDL above goal at 119.  Dina Mobley start 80 mg of Lipitor today.  3.  Nonsustained VT: Has been up to 5 beats seen on telemetry.  Plan to increase metoprolol.  No indication for antiarrhythmics at this time.  For questions or updates, please contact [facility] Please consult [email or web address] for contact info under Cardiology/STEMI.      Signed, Jeury Mcnab Jorja LoaMartin Tashena Ibach, MD  07/30/2017, 9:14 AM

## 2017-07-31 ENCOUNTER — Inpatient Hospital Stay (HOSPITAL_COMMUNITY): Payer: Federal, State, Local not specified - PPO

## 2017-07-31 ENCOUNTER — Other Ambulatory Visit: Payer: Self-pay

## 2017-07-31 ENCOUNTER — Encounter (HOSPITAL_COMMUNITY): Payer: Self-pay | Admitting: *Deleted

## 2017-07-31 DIAGNOSIS — I429 Cardiomyopathy, unspecified: Secondary | ICD-10-CM

## 2017-07-31 LAB — BASIC METABOLIC PANEL
Anion gap: 11 (ref 5–15)
BUN: 10 mg/dL (ref 6–20)
CO2: 23 mmol/L (ref 22–32)
Calcium: 8.5 mg/dL — ABNORMAL LOW (ref 8.9–10.3)
Chloride: 103 mmol/L (ref 101–111)
Creatinine, Ser: 0.74 mg/dL (ref 0.61–1.24)
GFR calc Af Amer: >60 mL/min (ref >60)
GFR calc non Af Amer: >60 mL/min (ref >60)
Glucose, Bld: 207 mg/dL — ABNORMAL HIGH (ref 65–99)
Potassium: 3.4 mmol/L — ABNORMAL LOW (ref 3.5–5.1)
Sodium: 137 mmol/L (ref 135–145)

## 2017-07-31 LAB — GLUCOSE, CAPILLARY
Glucose-Capillary: 213 mg/dL — ABNORMAL HIGH (ref 65–99)
Glucose-Capillary: 195 mg/dL — ABNORMAL HIGH (ref 65–99)
Glucose-Capillary: 247 mg/dL — ABNORMAL HIGH (ref 65–99)
Glucose-Capillary: 235 mg/dL — ABNORMAL HIGH (ref 65–99)

## 2017-07-31 LAB — ECHOCARDIOGRAM COMPLETE
Height: 70 in
Weight: 2843.05 oz

## 2017-07-31 MED ORDER — SODIUM CHLORIDE 0.9% FLUSH
3.0000 mL | Freq: Two times a day (BID) | INTRAVENOUS | Status: DC
  Administered 2017-07-31 (×2): 3 mL via INTRAVENOUS

## 2017-07-31 MED ORDER — METOPROLOL TARTRATE 25 MG PO TABS
50.0000 mg | ORAL_TABLET | Freq: Two times a day (BID) | ORAL | Status: DC
  Administered 2017-07-31 – 2017-08-02 (×5): 50 mg via ORAL
  Filled 2017-07-31: qty 2
  Filled 2017-07-31: qty 1
  Filled 2017-07-31: qty 2
  Filled 2017-07-31: qty 1
  Filled 2017-07-31: qty 2

## 2017-07-31 MED ORDER — SODIUM CHLORIDE 0.9% FLUSH: 3.0000 mL | INTRAVENOUS | Status: DC | PRN

## 2017-07-31 MED ORDER — SODIUM CHLORIDE 0.9 % IV SOLN: 250.0000 mL | INTRAVENOUS | Status: DC | PRN

## 2017-07-31 MED ORDER — CLOPIDOGREL BISULFATE 75 MG PO TABS
75.0000 mg | ORAL_TABLET | Freq: Every day | ORAL | Status: DC
  Administered 2017-08-02: 09:00:00 75 mg via ORAL
  Filled 2017-07-31: qty 1

## 2017-07-31 MED ORDER — SODIUM CHLORIDE 0.9% FLUSH: 3.0000 mL | Freq: Two times a day (BID) | INTRAVENOUS | Status: DC

## 2017-07-31 MED ORDER — POTASSIUM CHLORIDE CRYS ER 10 MEQ PO TBCR
40.0000 meq | EXTENDED_RELEASE_TABLET | Freq: Once | ORAL | Status: AC
  Administered 2017-07-31: 40 meq via ORAL
  Filled 2017-07-31 (×2): qty 4

## 2017-07-31 MED ORDER — CLOPIDOGREL BISULFATE 75 MG PO TABS
150.0000 mg | ORAL_TABLET | ORAL | Status: AC
  Administered 2017-08-01: 150 mg via ORAL
  Filled 2017-07-31: qty 2

## 2017-07-31 MED ORDER — ASPIRIN 81 MG PO CHEW
81.0000 mg | CHEWABLE_TABLET | ORAL | Status: AC
  Administered 2017-08-01: 81 mg via ORAL
  Filled 2017-07-31: qty 1

## 2017-07-31 MED ORDER — SODIUM CHLORIDE 0.9 % WEIGHT BASED INFUSION: 3.0000 mL/kg/h | INTRAVENOUS | Status: DC

## 2017-07-31 MED ORDER — SODIUM CHLORIDE 0.9 % WEIGHT BASED INFUSION: 1.0000 mL/kg/h | INTRAVENOUS | Status: DC

## 2017-07-31 MED ORDER — RAMIPRIL 5 MG PO CAPS
5.0000 mg | ORAL_CAPSULE | Freq: Two times a day (BID) | ORAL | Status: DC
  Administered 2017-07-31 – 2017-08-02 (×5): 5 mg via ORAL
  Filled 2017-07-31 (×2): qty 1
  Filled 2017-07-31: qty 2
  Filled 2017-07-31 (×2): qty 1

## 2017-07-31 MED ORDER — SODIUM CHLORIDE 0.9 % WEIGHT BASED INFUSION
3.0000 mL/kg/h | INTRAVENOUS | Status: DC
  Administered 2017-08-01: 3 mL/kg/h via INTRAVENOUS

## 2017-07-31 MED ORDER — SODIUM CHLORIDE 0.9 % WEIGHT BASED INFUSION
1.0000 mL/kg/h | INTRAVENOUS | Status: DC
  Administered 2017-08-01: 1 mL/kg/h via INTRAVENOUS

## 2017-07-31 MED ORDER — ASPIRIN 81 MG PO CHEW
81.0000 mg | CHEWABLE_TABLET | Freq: Every day | ORAL | Status: DC
  Administered 2017-08-02: 81 mg via ORAL
  Filled 2017-07-31: qty 1

## 2017-07-31 NOTE — Progress Notes (Signed)
Transferred to 2C08 via wheelchair. Portable monitor on. No changes. Report given to Jae DireKate, [place]RN

## 2017-07-31 NOTE — Plan of Care (Signed)
Pt complaining of little to no pain. Pt walked 2.5 laps around the unit during night shift and tolerated ambulation well. Vascular sites continue to be level 0.

## 2017-07-31 NOTE — Progress Notes (Signed)
  Echocardiogram 2D Echocardiogram has been performed.  Roosvelt MaserLane, Teriyah Purington F 07/31/2017, 2:41 PM

## 2017-07-31 NOTE — Progress Notes (Signed)
Progress Note  Patient Name: Matthew Walker Date of Encounter: 07/31/2017  Primary Cardiologist: New  Subjective   This morning.  No current chest pain.  Inpatient Medications    Scheduled Meds: . aspirin  81 mg Oral Daily  . atorvastatin  80 mg Oral q1800  . clopidogrel  75 mg Oral Q breakfast  . heparin  5,000 Units Subcutaneous Q8H  . insulin aspart  0-9 Units Subcutaneous TID WC  . metoprolol tartrate  25 mg Oral BID  . pneumococcal 23 valent vaccine  0.5 mL Intramuscular Tomorrow-1000  . sodium chloride flush  3 mL Intravenous Q12H   Continuous Infusions: . sodium chloride    . nitroGLYCERIN     PRN Meds: sodium chloride, acetaminophen, ALPRAZolam, ondansetron (ZOFRAN) IV, oxyCODONE, sodium chloride flush   Vital Signs    Vitals:   07/31/17 0702 07/31/17 0732 07/31/17 0800 07/31/17 0801  BP: (!) 153/103 (!) 153/103 (!) 155/105   Pulse: 69 (!) 117 70   Resp: 15  11   Temp:    98 F (36.7 C)  TempSrc:    Oral  SpO2: 94%  97%   Weight:      Height:        Intake/Output Summary (Last 24 hours) at 07/31/2017 0831 Last data filed at 07/31/2017 0300 Gross per 24 hour  Intake 615.88 ml  Output 2425 ml  Net -1809.12 ml   Filed Weights   07/30/17 0200  Weight: 182 lb 8.7 oz (82.8 kg)    Telemetry    Sinus rhythm with PVCs- Personally Reviewed  ECG    None new- Personally Reviewed  Physical Exam   GEN: Well nourished, well developed, in no acute distress  HEENT: normal  Neck: no JVD, carotid bruits, or masses Cardiac: RRR; no murmurs, rubs, or gallops,no edema  Respiratory:  clear to auscultation bilaterally, normal work of breathing GI: soft, nontender, nondistended, + BS MS: no deformity or atrophy  Skin: warm and dry Neuro:  Strength and sensation are intact Psych: euthymic mood, full affect   Labs    Chemistry Recent Labs  Lab 07/30/17 0034 07/30/17 0354 07/31/17 0228  NA 137 137 137  K 3.7 3.7 3.4*  CL 102 103 103  CO2 21* 21* 23    GLUCOSE 238* 218* 207*  BUN 12 9 10   CREATININE 0.76 0.63 0.74  CALCIUM 9.2 8.7* 8.5*  PROT 6.2*  --   --   ALBUMIN 4.0  --   --   AST 25  --   --   ALT 35  --   --   ALKPHOS 72  --   --   BILITOT 1.3*  --   --   GFRNONAA >60 >60 >60  GFRAA >60 >60 >60  ANIONGAP 14 13 11      Hematology Recent Labs  Lab 07/30/17 0034 07/30/17 0354  WBC 12.5* 9.4  RBC 5.34 5.22  HGB 16.1 15.7  HCT 46.8 45.8  MCV 87.6 87.7  MCH 30.1 30.1  MCHC 34.4 34.3  RDW 13.3 13.3  PLT 140* 149*    Cardiac Enzymes Recent Labs  Lab 07/30/17 0034 07/30/17 0354 07/30/17 1007 07/30/17 1635  TROPONINI <0.03 10.08* 19.61* 11.17*   No results for input(s): TROPIPOC in the last 168 hours.   BNPNo results for input(s): BNP, PROBNP in the last 168 hours.   DDimer No results for input(s): DDIMER in the last 168 hours.   Radiology    No results found.  Cardiac Studies   LHC  Technically difficult procedure due to tortuosity in the right innominate artery preventing catheter control especially when attempting to engage the right coronary.  This led to crossover to femoral approach.  These difficulties prolong the time to reperfusion.  Severe three-vessel coronary disease with 80-90% proximal RCA stenosis.  RCA is supplying collaterals to the LAD.  25% distal left main narrowing.  95% stenosis in the proximal to mid circumflex within a tortuous segment.  Total occlusion of the mid LAD collateralized from the right coronary with thrombus staining.  The lesion is felt to represent the culprit for the patient's presentation.  The LAD was successfully treated with angioplasty and stenting using a 28 x 2.75 mm Synergy stent reducing the 100% stenosis to less than 10% with TIMI grade III flow.  Reperfusion arrhythmias and transient increase in chest discomfort was noted.  Normal left ventricular end-diastolic pressure.  Left ventricular function was not assessed.  Patient Profile     52 y.o. male who  presented to the hospital complaining of chest pain, found to have acute anterior STEMI.  Assessment & Plan    1.  ST elevation MI: Post LAD drug-eluting stent.  Troponin peaked at 10.  The patient is currently chest pain-free.  He is on dual antiplatelet therapy.  He does have significant coronary disease in addition to his stented LAD.  The plan is for interventional cardiology to discuss amongst themselves the next best plan of action.  His blood pressure is elevated today.  Jermone Geister increase his metoprolol and start him on ramipril.  Plan to transfer to the floor today.  2.  Hyperlipidemia: Continue high-dose statin  3.  Nonsustained VT: Has been less in the last 24 hours.  Plan to increase metoprolol.  4.  Hypertension: Blood pressure quite elevated today with an elevated diastolic.  Irys Nigh increase metoprolol and start ramipril.  For questions or updates, please contact [facility] Please consult [email or web address] for contact info under Cardiology/STEMI.      Signed, Nekita Pita Jorja LoaMartin Adalia Pettis, MD  07/31/2017, 8:31 AM

## 2017-08-01 ENCOUNTER — Encounter (HOSPITAL_COMMUNITY)
Admission: RE | Disposition: A | Discharge status: Home / Self Care | Payer: Self-pay | Source: Ambulatory Visit | Attending: Interventional Cardiology

## 2017-08-01 ENCOUNTER — Encounter (HOSPITAL_COMMUNITY): Payer: Self-pay | Admitting: Interventional Cardiology

## 2017-08-01 DIAGNOSIS — E785 Hyperlipidemia, unspecified: Secondary | ICD-10-CM | POA: Diagnosis not present

## 2017-08-01 DIAGNOSIS — I2510 Atherosclerotic heart disease of native coronary artery without angina pectoris: Secondary | ICD-10-CM

## 2017-08-01 DIAGNOSIS — I472 Ventricular tachycardia: Secondary | ICD-10-CM | POA: Diagnosis not present

## 2017-08-01 DIAGNOSIS — I2102 ST elevation (STEMI) myocardial infarction involving left anterior descending coronary artery: Secondary | ICD-10-CM | POA: Diagnosis not present

## 2017-08-01 DIAGNOSIS — E1159 Type 2 diabetes mellitus with other circulatory complications: Secondary | ICD-10-CM

## 2017-08-01 DIAGNOSIS — I2101 ST elevation (STEMI) myocardial infarction involving left main coronary artery: Secondary | ICD-10-CM | POA: Diagnosis not present

## 2017-08-01 DIAGNOSIS — I25119 Atherosclerotic heart disease of native coronary artery with unspecified angina pectoris: Secondary | ICD-10-CM

## 2017-08-01 DIAGNOSIS — I10 Essential (primary) hypertension: Secondary | ICD-10-CM | POA: Diagnosis not present

## 2017-08-01 DIAGNOSIS — Z720 Tobacco use: Secondary | ICD-10-CM | POA: Diagnosis not present

## 2017-08-01 DIAGNOSIS — Z23 Encounter for immunization: Secondary | ICD-10-CM | POA: Diagnosis not present

## 2017-08-01 DIAGNOSIS — Z9861 Coronary angioplasty status: Secondary | ICD-10-CM

## 2017-08-01 HISTORY — PX: LEFT HEART CATH AND CORONARY ANGIOGRAPHY: CATH118249

## 2017-08-01 HISTORY — PX: CORONARY STENT INTERVENTION: CATH118234

## 2017-08-01 LAB — POCT I-STAT, CHEM 8
BUN: 12 mg/dL (ref 6–20)
Calcium, Ion: 1.24 mmol/L (ref 1.15–1.40)
Chloride: 101 mmol/L (ref 101–111)
Creatinine, Ser: 0.6 mg/dL — ABNORMAL LOW (ref 0.61–1.24)
Glucose, Bld: 248 mg/dL — ABNORMAL HIGH (ref 65–99)
HCT: 48 % (ref 39.0–52.0)
Hemoglobin: 16.3 g/dL (ref 13.0–17.0)
Potassium: 3.6 mmol/L (ref 3.5–5.1)
Sodium: 139 mmol/L (ref 135–145)
TCO2: 23 mmol/L (ref 22–32)

## 2017-08-01 LAB — CBC
HCT: 42.9 % (ref 39.0–52.0)
Hemoglobin: 15 g/dL (ref 13.0–17.0)
MCH: 30.9 pg (ref 26.0–34.0)
MCHC: 35 g/dL (ref 30.0–36.0)
MCV: 88.5 fL (ref 78.0–100.0)
Platelets: 122 10*3/uL — ABNORMAL LOW (ref 150–400)
RBC: 4.85 MIL/uL (ref 4.22–5.81)
RDW: 13.3 % (ref 11.5–15.5)
WBC: 6.6 10*3/uL (ref 4.0–10.5)

## 2017-08-01 LAB — POCT ACTIVATED CLOTTING TIME
Activated Clotting Time: 252 seconds
Activated Clotting Time: 323 seconds
Activated Clotting Time: 335 seconds
Activated Clotting Time: 257 seconds
Activated Clotting Time: 312 seconds
Activated Clotting Time: 235 seconds
Activated Clotting Time: 175 seconds

## 2017-08-01 LAB — BASIC METABOLIC PANEL
Anion gap: 9 (ref 5–15)
BUN: 8 mg/dL (ref 6–20)
CO2: 23 mmol/L (ref 22–32)
Calcium: 8.5 mg/dL — ABNORMAL LOW (ref 8.9–10.3)
Chloride: 105 mmol/L (ref 101–111)
Creatinine, Ser: 0.73 mg/dL (ref 0.61–1.24)
GFR calc Af Amer: >60 mL/min (ref >60)
GFR calc non Af Amer: >60 mL/min (ref >60)
Glucose, Bld: 233 mg/dL — ABNORMAL HIGH (ref 65–99)
Potassium: 3.6 mmol/L (ref 3.5–5.1)
Sodium: 137 mmol/L (ref 135–145)

## 2017-08-01 LAB — GLUCOSE, CAPILLARY
Glucose-Capillary: 197 mg/dL — ABNORMAL HIGH (ref 65–99)
Glucose-Capillary: 279 mg/dL — ABNORMAL HIGH (ref 65–99)
Glucose-Capillary: 204 mg/dL — ABNORMAL HIGH (ref 65–99)

## 2017-08-01 SURGERY — CORONARY STENT INTERVENTION
Anesthesia: LOCAL

## 2017-08-01 MED ORDER — LABETALOL HCL 5 MG/ML IV SOLN: 10.0000 mg | INTRAVENOUS | Status: AC | PRN

## 2017-08-01 MED ORDER — FENTANYL CITRATE (PF) 100 MCG/2ML IJ SOLN
INTRAMUSCULAR | Status: AC
  Filled 2017-08-01: qty 2

## 2017-08-01 MED ORDER — IOPAMIDOL (ISOVUE-370) INJECTION 76%
INTRAVENOUS | Status: AC
  Filled 2017-08-01: qty 125

## 2017-08-01 MED ORDER — HEPARIN (PORCINE) IN NACL 2-0.9 UNIT/ML-% IJ SOLN
INTRAMUSCULAR | Status: AC
  Filled 2017-08-01: qty 1000

## 2017-08-01 MED ORDER — IOHEXOL 350 MG/ML SOLN
INTRAVENOUS | Status: DC | PRN
  Administered 2017-08-01: 45 mL via INTRA_ARTERIAL

## 2017-08-01 MED ORDER — NITROGLYCERIN 0.4 MG SL SUBL
SUBLINGUAL_TABLET | SUBLINGUAL | Status: DC | PRN
  Administered 2017-08-01: .4 mg via SUBLINGUAL

## 2017-08-01 MED ORDER — SODIUM CHLORIDE 0.9% FLUSH: 3.0000 mL | INTRAVENOUS | Status: DC | PRN

## 2017-08-01 MED ORDER — HYDRALAZINE HCL 20 MG/ML IJ SOLN
5.0000 mg | INTRAMUSCULAR | Status: AC | PRN
  Administered 2017-08-01 (×2): 5 mg via INTRAVENOUS
  Filled 2017-08-01: qty 1

## 2017-08-01 MED ORDER — MIDAZOLAM HCL 2 MG/2ML IJ SOLN
INTRAMUSCULAR | Status: AC
  Filled 2017-08-01: qty 2

## 2017-08-01 MED ORDER — FENTANYL CITRATE (PF) 100 MCG/2ML IJ SOLN
INTRAMUSCULAR | Status: DC | PRN
  Administered 2017-08-01: 50 ug via INTRAVENOUS
  Administered 2017-08-01 (×2): 25 ug via INTRAVENOUS

## 2017-08-01 MED ORDER — SODIUM CHLORIDE 0.9 % IV SOLN: INTRAVENOUS | Status: AC

## 2017-08-01 MED ORDER — IOPAMIDOL (ISOVUE-370) INJECTION 76%
INTRAVENOUS | Status: DC | PRN
  Administered 2017-08-01: 115 mL via INTRA_ARTERIAL

## 2017-08-01 MED ORDER — HEPARIN SODIUM (PORCINE) 1000 UNIT/ML IJ SOLN
INTRAMUSCULAR | Status: DC | PRN
  Administered 2017-08-01: 6500 [IU] via INTRAVENOUS
  Administered 2017-08-01: 3000 [IU] via INTRAVENOUS

## 2017-08-01 MED ORDER — SODIUM CHLORIDE 0.9 % IV SOLN: 250.0000 mL | INTRAVENOUS | Status: DC | PRN

## 2017-08-01 MED ORDER — MORPHINE SULFATE (PF) 4 MG/ML IV SOLN: 2.0000 mg | INTRAVENOUS | Status: DC | PRN

## 2017-08-01 MED ORDER — ANGIOPLASTY BOOK
Freq: Once | Status: AC
  Administered 2017-08-01: 15:00:00
  Filled 2017-08-01: qty 1

## 2017-08-01 MED ORDER — LIVING WELL WITH DIABETES BOOK
Freq: Once | Status: AC
  Administered 2017-08-01: 15:00:00
  Filled 2017-08-01: qty 1

## 2017-08-01 MED ORDER — HEPARIN (PORCINE) IN NACL 2-0.9 UNIT/ML-% IJ SOLN
INTRAMUSCULAR | Status: AC
  Filled 2017-08-01: qty 500

## 2017-08-01 MED ORDER — HEPARIN SODIUM (PORCINE) 1000 UNIT/ML IJ SOLN
INTRAMUSCULAR | Status: AC
  Filled 2017-08-01: qty 1

## 2017-08-01 MED ORDER — HEPARIN (PORCINE) IN NACL 2-0.9 UNIT/ML-% IJ SOLN
INTRAMUSCULAR | Status: AC | PRN
  Administered 2017-08-01 (×2): 500 mL via INTRA_ARTERIAL

## 2017-08-01 MED ORDER — SODIUM CHLORIDE 0.9% FLUSH
3.0000 mL | Freq: Two times a day (BID) | INTRAVENOUS | Status: DC
  Administered 2017-08-01: 23:00:00 3 mL via INTRAVENOUS

## 2017-08-01 MED ORDER — MIDAZOLAM HCL 2 MG/2ML IJ SOLN
INTRAMUSCULAR | Status: DC | PRN
  Administered 2017-08-01: 1 mg via INTRAVENOUS
  Administered 2017-08-01: 2 mg via INTRAVENOUS
  Administered 2017-08-01: 1 mg via INTRAVENOUS

## 2017-08-01 MED ORDER — HEART ATTACK BOUNCING BOOK
Freq: Once | Status: AC
  Administered 2017-08-01: 15:00:00
  Filled 2017-08-01: qty 1

## 2017-08-01 MED ORDER — NITROGLYCERIN 0.4 MG SL SUBL
SUBLINGUAL_TABLET | SUBLINGUAL | Status: AC
  Filled 2017-08-01: qty 1

## 2017-08-01 MED ORDER — NITROGLYCERIN 1 MG/10 ML FOR IR/CATH LAB
INTRA_ARTERIAL | Status: DC | PRN
  Administered 2017-08-01: 200 ug via INTRACORONARY

## 2017-08-01 MED ORDER — LIDOCAINE HCL (PF) 1 % IJ SOLN
INTRAMUSCULAR | Status: DC | PRN
  Administered 2017-08-01: 22 mL

## 2017-08-01 MED ORDER — LIDOCAINE HCL (PF) 1 % IJ SOLN
INTRAMUSCULAR | Status: AC
  Filled 2017-08-01: qty 30

## 2017-08-01 MED FILL — Heparin Sodium (Porcine) 2 Unit/ML in Sodium Chloride 0.9%: INTRAMUSCULAR | Qty: 1000 | Status: AC

## 2017-08-01 MED FILL — Heparin Sodium (Porcine) 2 Unit/ML in Sodium Chloride 0.9%: INTRAMUSCULAR | Qty: 500 | Status: AC

## 2017-08-01 SURGICAL SUPPLY — 16 items
BALLN SAPPHIRE 2.5X12 (BALLOONS) ×2
BALLN SAPPHIRE ~~LOC~~ 4.0X8 (BALLOONS) ×2 IMPLANT
BALLOON SAPPHIRE 2.5X12 (BALLOONS) ×1 IMPLANT
CATH VISTA GUIDE 6FR AR2 (CATHETERS) ×2 IMPLANT
CATH VISTA GUIDE 6FR XB4 (CATHETERS) ×2 IMPLANT
CATH VISTA GUIDE 6FR XBLAD3.5 (CATHETERS) ×2 IMPLANT
KIT ENCORE 26 ADVANTAGE (KITS) ×2 IMPLANT
KIT HEART LEFT (KITS) ×2 IMPLANT
PACK CARDIAC CATHETERIZATION (CUSTOM PROCEDURE TRAY) ×2 IMPLANT
SHEATH AVANTI 11CM 6FR (SHEATH) ×2 IMPLANT
STENT SYNERGY DES 3.5X12 (Permanent Stent) ×2 IMPLANT
STENT SYNERGY DES 3.5X16 (Permanent Stent) ×4 IMPLANT
TRANSDUCER W/STOPCOCK (MISCELLANEOUS) ×2 IMPLANT
TUBING CIL FLEX 10 FLL-RA (TUBING) ×2 IMPLANT
WIRE EMERALD 3MM-J .035X150CM (WIRE) ×2 IMPLANT
WIRE MARVEL STR TIP 190CM (WIRE) ×2 IMPLANT

## 2017-08-01 NOTE — Progress Notes (Signed)
Spoke with pt and wife @ bedside about A1C results 10.6 (average blood glucose 258 over the past 2-3 months) with them and explained what an A1C is, basic pathophysiology of DM Type 2, basic home care, basic diabetes diet nutrition principles, importance of checking CBGs and maintaining good CBG control to prevent long-term and short-term complications. Reviewed signs and symptoms of hyperglycemia and hypoglycemia and how to treat hypoglycemia at home. Also reviewed blood sugar goals at home.  RNs to provide ongoing basic DM education at bedside with this patient.  Patient shared that he was aware that his A1c was increasing but didn't know the association with cardiac stenting and glycemic control. Patient has been disciplined in the past with limiting sweets and carbohydrates and plans to decrease number of carbohydrates and begin cardiac rehab. Patient has not taken insulin in the past and willing to take as necessary but would like to try without insulin and followup with PCP. RNs to begin allowing patient to give own injections and review insulin teaching with patient in case discharged home on insulin.  Thank you, Matthew FischerJudy E. Janelis Stelzer, RN, MSN, CDE  Diabetes Coordinator Inpatient Glycemic Control Team Team Pager [phone] (8am-5pm) 08/01/2017 2:33 PM

## 2017-08-01 NOTE — Interval H&P Note (Signed)
History and Physical Interval Note:  08/01/2017 8:54 AM  Matthew Walker  has presented today for surgery, with the diagnosis of CAD (2 remaining Vessels with ~90-95% stenosis)  The various methods of treatment have been discussed with the patient and family. After consideration of risks, benefits and other options for treatment, the patient has consented to  Procedure(s): CORONARY STENT INTERVENTION (N/A) as a surgical intervention .  The patient's history has been reviewed, patient examined, no change in status, stable for surgery.  I have reviewed the patient's chart and labs.  Questions were answered to the patient's satisfaction.    Cath Lab Visit (complete for each Cath Lab visit)  Clinical Evaluation Leading to the Procedure:   ACS: No. - recent Anterior STEMI  Non-ACS:    Anginal Classification: CCS II - Severe lesions following LAD PCI for Anterior STEMI  Anti-ischemic medical therapy: Minimal Therapy (1 class of medications)  Non-Invasive Test Results: No non-invasive testing performed  Prior CABG: No previous CABG   Bryan Lemmaavid Jasha Hodzic

## 2017-08-01 NOTE — Progress Notes (Addendum)
Progress Note  Patient Name: Matthew Walker Date of Encounter: 08/01/2017  Primary Cardiologist: Lesleigh NoeHenry W Reanne Nellums III, MD   Subjective   Has been asymptomatic since completion of LAD PCI early Saturday morning.  No bleeding complications.  Both right radial and right femoral access sites are unremarkable.  Multiple family members in the room including his sister who is a Engineer, civil (consulting)nurse.  Described treatment options with PCI of circumflex and RCA as the most prudent approach given wide patency of the LAD after STEMI therapy.    Inpatient Medications    Scheduled Meds: . [START ON 08/02/2017] aspirin  81 mg Oral Daily  . atorvastatin  80 mg Oral q1800  . [START ON 08/02/2017] clopidogrel  75 mg Oral Q breakfast  . heparin  5,000 Units Subcutaneous Q8H  . insulin aspart  0-9 Units Subcutaneous TID WC  . metoprolol tartrate  50 mg Oral BID  . pneumococcal 23 valent vaccine  0.5 mL Intramuscular Tomorrow-1000  . ramipril  5 mg Oral BID  . sodium chloride flush  3 mL Intravenous Q12H  . sodium chloride flush  3 mL Intravenous Q12H   Continuous Infusions: . sodium chloride    . sodium chloride    . sodium chloride 1 mL/kg/hr (08/01/17 0700)  . nitroGLYCERIN     PRN Meds: sodium chloride, sodium chloride, acetaminophen, ALPRAZolam, ondansetron (ZOFRAN) IV, oxyCODONE, sodium chloride flush, sodium chloride flush   Vital Signs    Vitals:   07/31/17 2356 08/01/17 0451 08/01/17 0824 08/01/17 0834  BP: 137/89 (!) 153/101 (!) 141/87   Pulse: 61 60 61   Resp: 19 17 13    Temp: 98.7 F (37.1 C) 98.1 F (36.7 C)  98.6 F (37 C)  TempSrc: Oral Oral  Oral  SpO2: 94% 94%    Weight:      Height:        Intake/Output Summary (Last 24 hours) at 08/01/2017 0844 Last data filed at 08/01/2017 [id number] Gross per 24 hour  Intake 720 ml  Output 2100 ml  Net -1380 ml   Filed Weights   07/30/17 0200 07/31/17 1058  Weight: 182 lb 8.7 oz (82.8 kg) 177 lb 11.1 oz (80.6 kg)    Telemetry    Sinus rhythm-  Personally Reviewed  ECG    Sinus rhythm with inferior and precordial anterior T wave abnormality and Q waves.  No acute changes noted.  Tracing 07/30/2017.- Personally Reviewed  Physical Exam  No distress GEN: No acute distress.   Neck: No JVD Cardiac: RRR, no murmurs, rubs, or gallops.  Both right radial and femoral access sites unremarkable.  No evidence of hematoma.  Mild ecchymosis in the right femoral. Respiratory: Clear to auscultation bilaterally. GI: Soft, nontender, non-distended  MS: No edema; No deformity. Neuro:  Nonfocal  Psych: Normal affect   Labs    Chemistry Recent Labs  Lab 07/30/17 0034 07/30/17 0354 07/31/17 0228 08/01/17 0547  NA 137 137 137 137  K 3.7 3.7 3.4* 3.6  CL 102 103 103 105  CO2 21* 21* 23 23  GLUCOSE 238* 218* 207* 233*  BUN 12 9 10 8   CREATININE 0.76 0.63 0.74 0.73  CALCIUM 9.2 8.7* 8.5* 8.5*  PROT 6.2*  --   --   --   ALBUMIN 4.0  --   --   --   AST 25  --   --   --   ALT 35  --   --   --   [facility]  --   --   --   BILITOT 1.3*  --   --   --   GFRNONAA >60 >60 >60 >60  GFRAA >60 >60 >60 >60  ANIONGAP 14 13 11 9      Hematology Recent Labs  Lab 07/30/17 0034 07/30/17 0354 08/01/17 0547  WBC 12.5* 9.4 6.6  RBC 5.34 5.22 4.85  HGB 16.1 15.7 15.0  HCT 46.8 45.8 42.9  MCV 87.6 87.7 88.5  MCH 30.1 30.1 30.9  MCHC 34.4 34.3 35.0  RDW 13.3 13.3 13.3  PLT 140* 149* 122*    Cardiac Enzymes Recent Labs  Lab 07/30/17 0034 07/30/17 0354 07/30/17 1007 07/30/17 1635  TROPONINI <0.03 10.08* 19.61* 11.17*   No results for input(s): TROPIPOC in the last 168 hours.   BNPNo results for input(s): BNP, PROBNP in the last 168 hours.   DDimer No results for input(s): DDIMER in the last 168 hours.   Radiology    No results found.  Cardiac Studies  Cardiac catheterization with emergency PCI 07/30/2017 Coronary Diagrams   Diagnostic Diagram       Post-Intervention Diagram          Patient Profile     52 y.o. male  with poorly controlled diabetes, hyperlipidemia, hypertension, who presented with an anterior ST elevation myocardial infarction on 07/30/2017.  He underwent successful DES to LAD which was being collateralized by the right coronary.  Both the RCA and circumflex were noted to have high-grade obstruction.  Tortuosity in the right innominate artery present at completion of the procedure from that approach causing switch over to the femoral approach.  Assessment & Plan    1. Acute anterior ST elevation myocardial infarction treated with drug-eluting stent on presentation.  Residual severe non-culprit disease in the RCA and circumflex.  Plan double vessel intervention today with stent implantation.  Procedure and risk discussed in detail with the patient and family.  Risk of bleeding, stroke, death, myocardial infarction, emergency surgery, and limb ischemia were discussed in detail and accepted by the patient.  Aggregate risk of these complications is 1-2%. 2. Hyperlipidemia with LDL target less than 70.  High intensity statin therapy has been prescribed. 3. Diabetes mellitus type 2, poorly controlled, with admitting hemoglobin A1c of 10.8.  Needs aggressive control to A1c less than 7.  Myocardial infarction and cardiovascular disease are related to poorly controlled diabetes. 4. Essential hypertension with target blood pressure 130/80 mmHg or less. 5. Tobacco use.  Discussed cessation but patient is not willing to commit at this point. 6. Hypokalemia has been treated and needs follow-up. 7. Nonsustained VT has resolved.  Moderate risk PCI to both RCA and circumflex.  Vessel tortuosity is a reason for the slightly increased risk.  For questions or updates, please contact [facility] Please consult [email or web address] for contact info under Cardiology/STEMI.      Signed, Lesleigh Noe, MD  08/01/2017, 8:44 AM

## 2017-08-01 NOTE — Research (Signed)
AEGIS II Research study protocol review with patient. Questions encouraged and answered. ICF left for patient and family to review. Patient states he will review with his sister who once was Dr. Regino SchultzeBrodie's nurse. Research will follow up in morning.

## 2017-08-01 NOTE — Progress Notes (Signed)
Results for Matthew Walker, Matthew Walker (MRN [id number]) as of 08/01/2017 12:39  Ref. Range 07/31/2017 07:57 07/31/2017 12:35 07/31/2017 16:53 07/31/2017 21:45 08/01/2017 08:37  Glucose-Capillary Latest Ref Range: 65 - 99 mg/dL 409213 (H) 811195 (H) 914247 (H) 235 (H) 197 (H)  Noted that patient's HgbA1C is 10.8%. Recommend that patient may need to start on Lantus 12 - 15 units daily in order to aggressively lower the A1C. Will need to follow up with a PCP. Patient will need to get a PCP and does not have health insurance. Care management will need to follow up with patient if going home on insulin.   Smith MinceKendra Giavonni Cizek RN BSN CDE Diabetes Coordinator Pager: [phone]  8am-5pm

## 2017-08-01 NOTE — H&P (View-Only) (Signed)
Progress Note  Patient Name: Matthew SharperJon M Walker Date of Encounter: 08/01/2017  Primary Cardiologist: Lesleigh NoeHenry W Aws Shere III, MD   Subjective   Has been asymptomatic since completion of LAD PCI early Saturday morning.  No bleeding complications.  Both right radial and right femoral access sites are unremarkable.  Multiple family members in the room including his sister who is a Engineer, civil (consulting)nurse.  Described treatment options with PCI of circumflex and RCA as the most prudent approach given wide patency of the LAD after STEMI therapy.    Inpatient Medications    Scheduled Meds: . [START ON 08/02/2017] aspirin  81 mg Oral Daily  . atorvastatin  80 mg Oral q1800  . [START ON 08/02/2017] clopidogrel  75 mg Oral Q breakfast  . heparin  5,000 Units Subcutaneous Q8H  . insulin aspart  0-9 Units Subcutaneous TID WC  . metoprolol tartrate  50 mg Oral BID  . pneumococcal 23 valent vaccine  0.5 mL Intramuscular Tomorrow-1000  . ramipril  5 mg Oral BID  . sodium chloride flush  3 mL Intravenous Q12H  . sodium chloride flush  3 mL Intravenous Q12H   Continuous Infusions: . sodium chloride    . sodium chloride    . sodium chloride 1 mL/kg/hr (08/01/17 0700)  . nitroGLYCERIN     PRN Meds: sodium chloride, sodium chloride, acetaminophen, ALPRAZolam, ondansetron (ZOFRAN) IV, oxyCODONE, sodium chloride flush, sodium chloride flush   Vital Signs    Vitals:   07/31/17 2356 08/01/17 0451 08/01/17 0824 08/01/17 0834  BP: 137/89 (!) 153/101 (!) 141/87   Pulse: 61 60 61   Resp: 19 17 13    Temp: 98.7 F (37.1 C) 98.1 F (36.7 C)  98.6 F (37 C)  TempSrc: Oral Oral  Oral  SpO2: 94% 94%    Weight:      Height:        Intake/Output Summary (Last 24 hours) at 08/01/2017 0844 Last data filed at 08/01/2017 [id number] Gross per 24 hour  Intake 720 ml  Output 2100 ml  Net -1380 ml   Filed Weights   07/30/17 0200 07/31/17 1058  Weight: 182 lb 8.7 oz (82.8 kg) 177 lb 11.1 oz (80.6 kg)    Telemetry    Sinus rhythm-  Personally Reviewed  ECG    Sinus rhythm with inferior and precordial anterior T wave abnormality and Q waves.  No acute changes noted.  Tracing 07/30/2017.- Personally Reviewed  Physical Exam  No distress GEN: No acute distress.   Neck: No JVD Cardiac: RRR, no murmurs, rubs, or gallops.  Both right radial and femoral access sites unremarkable.  No evidence of hematoma.  Mild ecchymosis in the right femoral. Respiratory: Clear to auscultation bilaterally. GI: Soft, nontender, non-distended  MS: No edema; No deformity. Neuro:  Nonfocal  Psych: Normal affect   Labs    Chemistry Recent Labs  Lab 07/30/17 0034 07/30/17 0354 07/31/17 0228 08/01/17 0547  NA 137 137 137 137  K 3.7 3.7 3.4* 3.6  CL 102 103 103 105  CO2 21* 21* 23 23  GLUCOSE 238* 218* 207* 233*  BUN 12 9 10 8   CREATININE 0.76 0.63 0.74 0.73  CALCIUM 9.2 8.7* 8.5* 8.5*  PROT 6.2*  --   --   --   ALBUMIN 4.0  --   --   --   AST 25  --   --   --   ALT 35  --   --   --   [facility]  72  --   --   --   BILITOT 1.3*  --   --   --   GFRNONAA >60 >60 >60 >60  GFRAA >60 >60 >60 >60  ANIONGAP 14 13 11 9      Hematology Recent Labs  Lab 07/30/17 0034 07/30/17 0354 08/01/17 0547  WBC 12.5* 9.4 6.6  RBC 5.34 5.22 4.85  HGB 16.1 15.7 15.0  HCT 46.8 45.8 42.9  MCV 87.6 87.7 88.5  MCH 30.1 30.1 30.9  MCHC 34.4 34.3 35.0  RDW 13.3 13.3 13.3  PLT 140* 149* 122*    Cardiac Enzymes Recent Labs  Lab 07/30/17 0034 07/30/17 0354 07/30/17 1007 07/30/17 1635  TROPONINI <0.03 10.08* 19.61* 11.17*   No results for input(s): TROPIPOC in the last 168 hours.   BNPNo results for input(s): BNP, PROBNP in the last 168 hours.   DDimer No results for input(s): DDIMER in the last 168 hours.   Radiology    No results found.  Cardiac Studies  Cardiac catheterization with emergency PCI 07/30/2017 Coronary Diagrams   Diagnostic Diagram       Post-Intervention Diagram          Patient Profile     52 y.o. male  with poorly controlled diabetes, hyperlipidemia, hypertension, who presented with an anterior ST elevation myocardial infarction on 07/30/2017.  He underwent successful DES to LAD which was being collateralized by the right coronary.  Both the RCA and circumflex were noted to have high-grade obstruction.  Tortuosity in the right innominate artery present at completion of the procedure from that approach causing switch over to the femoral approach.  Assessment & Plan    1. Acute anterior ST elevation myocardial infarction treated with drug-eluting stent on presentation.  Residual severe non-culprit disease in the RCA and circumflex.  Plan double vessel intervention today with stent implantation.  Procedure and risk discussed in detail with the patient and family.  Risk of bleeding, stroke, death, myocardial infarction, emergency surgery, and limb ischemia were discussed in detail and accepted by the patient.  Aggregate risk of these complications is 1-2%. 2. Hyperlipidemia with LDL target less than 70.  High intensity statin therapy has been prescribed. 3. Diabetes mellitus type 2, with admitting hemoglobin A1c of 10.8.  Needs aggressive control to A1c less than 7. 4. Essential hypertension with target blood pressure 130/80 mmHg or less. 5. Tobacco use.  Discussed cessation but patient is not willing to commit at this point.  Moderate risk PCI to both RCA and circumflex.  Vessel tortuosity is a reason for the slightly increased risk.  For questions or updates, please contact [facility] Please consult [email or web address] for contact info under Cardiology/STEMI.      Signed, Lesleigh NoeHenry W [name], MD  08/01/2017, 8:44 AM

## 2017-08-01 NOTE — Progress Notes (Addendum)
Site area: right groin  Site Prior to Removal:  Level 1, oozing with small hematoma noted when dressing removed   Pressure Applied For 20 MINUTES    Minutes Beginning at 1230  Manual:   Yes.    Patient Status During Pull:  Stable, tolerated well  Post Pull Groin Site:  Level 0  Post Pull Instructions Given:  Yes.    Post Pull Pulses Present:  Yes.    Dressing Applied:  Yes.    Comments:  Patient received from cath lab with oozing noted, drainage on dressing increasing prior to pull. Dressing removed and site continued to ooze with small hematoma note. Hematoma expressed when holding pressure, level 0 after holding pressure for 20 minutes. No bleeding, oozing, hematoma or bruising noted.  Bedrest started at 1250 until 1650.

## 2017-08-02 ENCOUNTER — Telehealth: Payer: Self-pay | Admitting: Interventional Cardiology

## 2017-08-02 ENCOUNTER — Encounter (HOSPITAL_COMMUNITY): Payer: Self-pay | Admitting: Cardiology

## 2017-08-02 DIAGNOSIS — Z23 Encounter for immunization: Secondary | ICD-10-CM | POA: Diagnosis not present

## 2017-08-02 DIAGNOSIS — Z720 Tobacco use: Secondary | ICD-10-CM | POA: Diagnosis not present

## 2017-08-02 DIAGNOSIS — I10 Essential (primary) hypertension: Secondary | ICD-10-CM | POA: Diagnosis not present

## 2017-08-02 DIAGNOSIS — E785 Hyperlipidemia, unspecified: Secondary | ICD-10-CM | POA: Diagnosis not present

## 2017-08-02 DIAGNOSIS — I2102 ST elevation (STEMI) myocardial infarction involving left anterior descending coronary artery: Secondary | ICD-10-CM | POA: Diagnosis not present

## 2017-08-02 DIAGNOSIS — E118 Type 2 diabetes mellitus with unspecified complications: Secondary | ICD-10-CM

## 2017-08-02 DIAGNOSIS — I472 Ventricular tachycardia: Secondary | ICD-10-CM | POA: Diagnosis not present

## 2017-08-02 DIAGNOSIS — I2101 ST elevation (STEMI) myocardial infarction involving left main coronary artery: Secondary | ICD-10-CM | POA: Diagnosis not present

## 2017-08-02 LAB — BASIC METABOLIC PANEL
Anion gap: 9 (ref 5–15)
BUN: 11 mg/dL (ref 6–20)
CO2: 21 mmol/L — ABNORMAL LOW (ref 22–32)
Calcium: 8.5 mg/dL — ABNORMAL LOW (ref 8.9–10.3)
Chloride: 106 mmol/L (ref 101–111)
Creatinine, Ser: 0.79 mg/dL (ref 0.61–1.24)
GFR calc Af Amer: >60 mL/min (ref >60)
GFR calc non Af Amer: >60 mL/min (ref >60)
Glucose, Bld: 258 mg/dL — ABNORMAL HIGH (ref 65–99)
Potassium: 3.7 mmol/L (ref 3.5–5.1)
Sodium: 136 mmol/L (ref 135–145)

## 2017-08-02 LAB — CBC
HCT: 44.1 % (ref 39.0–52.0)
Hemoglobin: 15.1 g/dL (ref 13.0–17.0)
MCH: 30.2 pg (ref 26.0–34.0)
MCHC: 34.2 g/dL (ref 30.0–36.0)
MCV: 88.2 fL (ref 78.0–100.0)
Platelets: 131 10*3/uL — ABNORMAL LOW (ref 150–400)
RBC: 5 MIL/uL (ref 4.22–5.81)
RDW: 13.2 % (ref 11.5–15.5)
WBC: 10.5 10*3/uL (ref 4.0–10.5)

## 2017-08-02 LAB — GLUCOSE, CAPILLARY
Glucose-Capillary: 238 mg/dL — ABNORMAL HIGH (ref 65–99)
Glucose-Capillary: 238 mg/dL — ABNORMAL HIGH (ref 65–99)

## 2017-08-02 MED ORDER — NITROGLYCERIN 0.4 MG SL SUBL: 0.4000 mg | SUBLINGUAL_TABLET | SUBLINGUAL | 2 refills | Status: DC | PRN

## 2017-08-02 MED ORDER — CLOPIDOGREL BISULFATE 75 MG PO TABS: 75.0000 mg | ORAL_TABLET | Freq: Every day | ORAL | 2 refills | Status: DC

## 2017-08-02 MED ORDER — LISINOPRIL-HYDROCHLOROTHIAZIDE 20-25 MG PO TABS: 0.5000 | ORAL_TABLET | Freq: Every day | ORAL | 0 refills | Status: DC

## 2017-08-02 MED ORDER — ASPIRIN 81 MG PO CHEW: 81.0000 mg | CHEWABLE_TABLET | Freq: Every day | ORAL | Status: DC

## 2017-08-02 MED ORDER — STUDY - AEGIS II STUDY - PLACEBO OR CSL112 (PI-HILTY)
170.0000 mL | INTRAVENOUS | Status: DC
  Administered 2017-08-02: 11:00:00 170 mL via INTRAVENOUS
  Filled 2017-08-02: qty 170

## 2017-08-02 MED ORDER — THE SENSUOUS HEART BOOK
Freq: Once | Status: DC
  Filled 2017-08-02: qty 1

## 2017-08-02 MED ORDER — ATORVASTATIN CALCIUM 80 MG PO TABS: 80.0000 mg | ORAL_TABLET | Freq: Every day | ORAL | 0 refills | Status: DC

## 2017-08-02 MED ORDER — METOPROLOL TARTRATE 50 MG PO TABS: 50.0000 mg | ORAL_TABLET | Freq: Two times a day (BID) | ORAL | 1 refills | Status: DC

## 2017-08-02 MED ORDER — ACTIVE PARTNERSHIP FOR HEALTH OF YOUR HEART BOOK
Freq: Once | Status: DC
  Filled 2017-08-02: qty 1

## 2017-08-02 MED ORDER — ANGIOPLASTY BOOK
Freq: Once | Status: AC
  Administered 2017-08-02: 01:00:00 1
  Filled 2017-08-02: qty 1

## 2017-08-02 MED FILL — Heparin Sodium (Porcine) 2 Unit/ML in Sodium Chloride 0.9%: INTRAMUSCULAR | Qty: 1000 | Status: AC

## 2017-08-02 MED FILL — Heparin Sodium (Porcine) 2 Unit/ML in Sodium Chloride 0.9%: INTRAMUSCULAR | Qty: 500 | Status: AC

## 2017-08-02 NOTE — Progress Notes (Signed)
CARDIAC REHAB PHASE I   PRE:  Rate/Rhythm: 74 SR    BP: sitting 150/90 manual (machine 174/99)    SaO2:   MODE:  Ambulation: 500 ft   POST:  Rate/Rhythm: 88 SR    BP: sitting 164/90 manual     SaO2:   Tolerated well, no c/o except groin pain. BP elevated this am. Used manual cuff but it is too large for him. Ed completed with good reception. Pt motivated to change. He understands importance of Plavix. Planning to quit smoking. Gave resources and fake cigarette. Will refer to [place] [facility] as [place] works [place] at [employer]. Pt seems slightly depressed with his situation but sts he is a competitive person and is motivated to change.   [id number] Harriet MassonRandi Kristan Estephani Popper CES, ACSM 08/02/2017 9:24 AM

## 2017-08-02 NOTE — Discharge Summary (Addendum)
The patient has been seen in conjunction with Laverda Page, NP. All aspects of care have been considered and discussed. The patient has been personally interviewed, examined, and all clinical data has been reviewed.   Status post anterior ST elevation myocardial infarction treated with emergency PCI and stent implantation.  Subsequent non-culprit high-grade obstruction in the right coronary and circumflex were treated with drug-eluting stents without complication.  Right femoral access site is unremarkable.  Laboratory data does not reveal evidence of infarction.  Plan discharge today with the following requirements: Transition of care follow-up in 1 week, high intensity statin therapy, liver and lipid panel in 6 weeks, primary care physician for improvement in diabetes control with goal A1c less than 7, heart healthy/carbohydrate modified diet, phase 2 cardiac rehab recommended, and smoking cessation.  Discharge Summary    Patient ID: Matthew Walker,  MRN: [id number], DOB/AGE: 06/24/1965 52 y.o.  Admit date: 07/30/2017 Discharge date: 08/02/2017  Primary Care Provider: Estanislado Pandy Primary Cardiologist: Dr. Katrinka Blazing  Discharge Diagnoses    Principal Problem:   STEMI (ST elevation myocardial infarction) [facility]) Active Problems:   Type 2 diabetes mellitus with complication, without long-term current use of insulin (HCC)   Hypertension, essential   Hyperlipidemia LDL goal <70   Acute ST elevation myocardial infarction (STEMI) involving left anterior descending (LAD) coronary artery (HCC)   Coronary artery disease involving native coronary artery of native heart with angina pectoris (HCC)   Tobacco use   Allergies Allergies  Allergen Reactions  . Bee Venom Other (See Comments)    Hay Fever    Diagnostic Studies/Procedures    Cath: 07/30/17  Conclusion    Technically difficult procedure due to tortuosity in the right innominate artery preventing catheter control especially  when attempting to engage the right coronary.  This led to crossover to femoral approach.  These difficulties prolong the time to reperfusion.  Severe three-vessel coronary disease with 80-90% proximal RCA stenosis.  RCA is supplying collaterals to the LAD.  25% distal left main narrowing.  95% stenosis in the proximal to mid circumflex within a tortuous segment.  Total occlusion of the mid LAD collateralized from the right coronary with thrombus staining.  The lesion is felt to represent the culprit for the patient's presentation.  The LAD was successfully treated with angioplasty and stenting using a 28 x 2.75 mm Synergy stent reducing the 100% stenosis to less than 10% with TIMI grade III flow.  Reperfusion arrhythmias and transient increase in chest discomfort was noted.  Normal left ventricular end-diastolic pressure.  Left ventricular function was not assessed.  RECOMMENDATIONS:   2D Doppler echocardiogram to fully assess LV function and aortic root size (suspected to be somewhat dilated based upon experience during the procedure).  Aggressive risk factor modification with high intensity statin therapy, smoking cessation, blood pressure control, and glycemic control.  Staged PCI on the circumflex and right coronary prior to discharge.  This needs to be discussed among the interventional team.   TTE: 07/31/17  Study Conclusions  - Left ventricle: The cavity size was normal. Wall thickness was   increased in a pattern of mild LVH. There was focal basal   hypertrophy. Systolic function was normal. The estimated ejection   fraction was in the range of 60% to 65%. Wall motion was normal;   there were no regional wall motion abnormalities. Features are   consistent with a pseudonormal left ventricular filling pattern,   with concomitant abnormal relaxation and increased  filling   pressure (grade 2 diastolic dysfunction). - Left atrium: The atrium was moderately  dilated.  Impressions:  - Normal LV systolic function   Grade 2 diastolic dysfunction  Cath: 08/01/17  Conclusion     Lesion #1: Prox RCA 90% stenosed  A drug-eluting stent was successfully placed using a STENT SYNERGY DES 3.5X16. --> Postdilated to 4.1 mm  Post intervention, there is a 0% residual stenosis.  Prox Cx to Mid Cx lesion is 95% stenosed.  2 Overlapping Drug-Eluting Stents were successfully placed using a STENT SYNERGY DES 3.5X16 with an overlapping STENT SYNERGY DES 3.5X12. - post-dilated to 4.1 mm  Post intervention, there is a 0% residual stenosis.  Patent LAD stent with stable Left Main and proximal LAD as well as OM 2 disease. Also distal RCA 50% lesion is stable as well.  LV end diastolic pressure is normal.   Successful 2 vessel DES PCI -of proximal RCA and Prox-Mid Cx.  The patient was given supplemental glycerin post PCI for mild chest pain.  Mild oozing from the sheath insertion site.  Plan: Transfer to 6 C Post-Procedure Unit Continue DAPT.   Continue RF modification per Dr. Katrinka Blazing.  Anticipate that he should be ready for discharge as early as tomorrow.  Bryan Lemma, MD  _____________   History of Present Illness     52 yo male who was transferred from [facility] for chest pain. On arrival to [facility] EKG was done that showed ST changes in the anterolateral leads, Code STEMI was called and he was transferred to [facility] for further work up. Follow up EKG showed Q waves in v1 through v4 as well as inferior leads. He was taken to the cath lab emergently for cardiac cath.   Hospital Course     Underwent cardiac cath with PCI/DES x1 to the mLAD with plans for staged intervention to the RCA and LCx. Placed on ASA/plavix. Placed on high dose statin, along with BB therapy as well. LDL noted at 119. Hgb A1c noted at 10.6. Was on metformin prior to admission, but needed diet education and seen by the diabetes coordinator. TRop peaked at  11.17. Smoking cessation discussed but he reported not being willing to commit at this time. Underwent staged intervention to the LCx and RCA with Dr. Herbie Baltimore on 08/01/17. Echo showed normal EF of 60-65% with no WMA, and G2DD. No complications noted. Plan to have him resume his lisinopril/HCTZ combination at discharge but at half dose. Worked well with cardiac rehab without recurrent chest pain. Aegis II research study was discussed with the patient and he was agreeable to being a part of this study prior to discharge. He was given a note for work and planned to remain out of work until seen at his follow up.   General: Well developed, well nourished, male appearing in no acute distress. Head: Normocephalic, atraumatic.  Neck: Supple without bruits, JVD. Lungs:  Resp regular and unlabored, CTA. Heart: RRR, S1, S2, no S3, S4, or murmur; no rub. Abdomen: Soft, non-tender, non-distended with normoactive bowel sounds. No hepatomegaly. No rebound/guarding. No obvious abdominal masses. Extremities: No clubbing, cyanosis, edema. Distal pedal pulses are 2+ bilaterally. Right radial/femoral cath site stable with mild bruising to the groin. Neuro: Alert and oriented X 3. Moves all extremities spontaneously. Psych: Normal affect.  TREVELL PARISEAU was seen by Dr. Katrinka Blazing and determined stable for discharge home. Follow up in the office has been arranged. Medications are listed below.   _____________  Discharge Vitals Blood pressure (!) 147/99, pulse 86, temperature 98.6 F (37 C), temperature source Oral, resp. rate 16, height 5\' 10"  (1.778 m), weight 176 lb 5.9 oz (80 kg), SpO2 97 %.  Filed Weights   07/30/17 0200 07/31/17 1058 08/02/17 0343  Weight: 182 lb 8.7 oz (82.8 kg) 177 lb 11.1 oz (80.6 kg) 176 lb 5.9 oz (80 kg)    Labs & Radiologic Studies    CBC Recent Labs    08/01/17 0547 08/02/17 0249  WBC 6.6 10.5  HGB 15.0 15.1  HCT 42.9 44.1  MCV 88.5 88.2  PLT 122* 131*   Basic Metabolic  Panel Recent Labs    08/01/17 0547 08/02/17 0249  NA 137 136  K 3.6 3.7  CL 105 106  CO2 23 21*  GLUCOSE 233* 258*  BUN 8 11  CREATININE 0.73 0.79  CALCIUM 8.5* 8.5*   Liver Function Tests No results for input(s): AST, ALT, ALKPHOS, BILITOT, PROT, ALBUMIN in the last 72 hours. No results for input(s): LIPASE, AMYLASE in the last 72 hours. Cardiac Enzymes Recent Labs    07/30/17 1635  TROPONINI 11.17*   BNP Invalid input(s): POCBNP D-Dimer No results for input(s): DDIMER in the last 72 hours. Hemoglobin A1C No results for input(s): HGBA1C in the last 72 hours. Fasting Lipid Panel No results for input(s): CHOL, HDL, LDLCALC, TRIG, CHOLHDL, LDLDIRECT in the last 72 hours. Thyroid Function Tests No results for input(s): TSH, T4TOTAL, T3FREE, THYROIDAB in the last 72 hours.  Invalid input(s): FREET3 _____________  No results found. Disposition   Pt is being discharged home today in good condition.  Follow-up Plans & Appointments    Follow-up Information    [place], Sharrell Ku, [place] Follow up on 08/16/2017.   Specialty:  Cardiology Why:  at 9am for your follow up appt.  Contact information: [address] [place] [address] [phone]        Estanislado Pandy, MD Follow up.   Specialty:  Family Medicine Why:  Please arrange follow up within the next 2 weeks regarding further management of your DM.  Contact information: [address] [place] [address] [phone]          Discharge Instructions    Amb Referral to Cardiac Rehabilitation   Complete by:  As directed    Diagnosis:   Coronary Stents PTCA STEMI     Call MD for:  redness, tenderness, or signs of infection (pain, swelling, redness, odor or green/yellow discharge around incision site)   Complete by:  As directed    Diet - low sodium heart healthy   Complete by:  As directed    Discharge instructions   Complete by:  As directed    Groin Site Care Refer to this sheet in the next  few weeks. These instructions provide you with information on caring for yourself after your procedure. Your caregiver may also give you more specific instructions. Your treatment has been planned according to current medical practices, but problems sometimes occur. Call your caregiver if you have any problems or questions after your procedure. HOME CARE INSTRUCTIONS You may shower 24 hours after the procedure. Remove the bandage (dressing) and gently wash the site with plain soap and water. Gently pat the site dry.  Do not apply powder or lotion to the site.  Do not sit in a bathtub, swimming pool, or whirlpool for 5 to 7 days.  No bending, squatting, or lifting anything over 10 pounds (4.5  kg) as directed by your caregiver.  Inspect the site at least twice daily.  Do not drive home if you are discharged the same day of the procedure. Have someone else drive you.  You may drive 24 hours after the procedure unless otherwise instructed by your caregiver.  What to expect: Any bruising will usually fade within 1 to 2 weeks.  Blood that collects in the tissue (hematoma) may be painful to the touch. It should usually decrease in size and tenderness within 1 to 2 weeks.  SEEK IMMEDIATE MEDICAL CARE IF: You have unusual pain at the groin site or down the affected leg.  You have redness, warmth, swelling, or pain at the groin site.  You have drainage (other than a small amount of blood on the dressing).  You have chills.  You have a fever or persistent symptoms for more than 72 hours.  You have a fever and your symptoms suddenly get worse.  Your leg becomes pale, cool, tingly, or numb.  You have heavy bleeding from the site. Hold pressure on the site. .  Radial Site Care Refer to this sheet in the next few weeks. These instructions provide you with information on caring for yourself after your procedure. Your caregiver may also give you more specific instructions. Your treatment has been planned  according to current medical practices, but problems sometimes occur. Call your caregiver if you have any problems or questions after your procedure. HOME CARE INSTRUCTIONS You may shower the day after the procedure.Remove the bandage (dressing) and gently wash the site with plain soap and water.Gently pat the site dry.  Do not apply powder or lotion to the site.  Do not submerge the affected site in water for 3 to 5 days.  Inspect the site at least twice daily.  Do not flex or bend the affected arm for 24 hours.  No lifting over 5 pounds (2.3 kg) for 5 days after your procedure.  Do not drive home if you are discharged the same day of the procedure. Have someone else drive you.  You may drive 24 hours after the procedure unless otherwise instructed by your caregiver.  What to expect: Any bruising will usually fade within 1 to 2 weeks.  Blood that collects in the tissue (hematoma) may be painful to the touch. It should usually decrease in size and tenderness within 1 to 2 weeks.  SEEK IMMEDIATE MEDICAL CARE IF: You have unusual pain at the radial site.  You have redness, warmth, swelling, or pain at the radial site.  You have drainage (other than a small amount of blood on the dressing).  You have chills.  You have a fever or persistent symptoms for more than 72 hours.  You have a fever and your symptoms suddenly get worse.  Your arm becomes pale, cool, tingly, or numb.  You have heavy bleeding from the site. Hold pressure on the site.   PLEASE DO NOT MISS ANY DOSES OF YOUR PLAVIX!!!!! Also keep a log of you blood pressures and bring back to your follow up appt. Please call the office with any questions.   Patients taking blood thinners should generally stay away from medicines like ibuprofen, Advil, Motrin, naproxen, and Aleve due to risk of stomach bleeding. You may take Tylenol as directed or talk to your primary doctor about alternatives.   Increase activity slowly   Complete by:  As  directed       Discharge Medications     Medication  List    STOP taking these medications   amLODipine 10 MG tablet Commonly known as:  NORVASC   ibuprofen 200 MG tablet Commonly known as:  ADVIL,MOTRIN   pravastatin 10 MG tablet Commonly known as:  PRAVACHOL     TAKE these medications   aspirin 81 MG chewable tablet Chew 1 tablet (81 mg total) by mouth daily.   atorvastatin 80 MG tablet Commonly known as:  LIPITOR Take 1 tablet (80 mg total) by mouth daily at 6 PM.   clopidogrel 75 MG tablet Commonly known as:  PLAVIX Take 1 tablet (75 mg total) by mouth daily with breakfast.   EMERGEN-C IMMUNE PO Take 1 packet by mouth daily.   fexofenadine 60 MG tablet Commonly known as:  ALLEGRA Take 60 mg by mouth daily as needed for allergies or rhinitis.   fluticasone 50 MCG/ACT nasal spray Commonly known as:  FLONASE Place 1 spray into both nostrils as needed for congestion.   lisinopril-hydrochlorothiazide 20-25 MG tablet Commonly known as:  PRINZIDE,ZESTORETIC Take 0.5 tablets by mouth daily. What changed:  how much to take   metFORMIN 500 MG 24 hr tablet Commonly known as:  GLUCOPHAGE-XR Take 1,000 mg by mouth 2 (two) times daily.   metoprolol tartrate 50 MG tablet Commonly known as:  LOPRESSOR Take 1 tablet (50 mg total) by mouth 2 (two) times daily.   nitroGLYCERIN 0.4 MG SL tablet Commonly known as:  NITROSTAT Place 1 tablet (0.4 mg total) under the tongue every 5 (five) minutes as needed.   sodium chloride 0.65 % Soln nasal spray Commonly known as:  OCEAN Place 1 spray into both nostrils as needed for congestion.   TRAVEL SICKNESS 25 MG Chew Generic drug:  Meclizine HCl Chew 25 mg by mouth as needed for dizziness.        Aspirin prescribed at discharge?  Yes High Intensity Statin Prescribed? (Lipitor 40-80mg  or Crestor 20-40mg ): Yes Beta Blocker Prescribed? Yes For EF <40%, was ACEI/ARB Prescribed? Yes ADP Receptor Inhibitor Prescribed? (i.e.  Plavix etc.-Includes Medically Managed Patients): Yes For EF <40%, Aldosterone Inhibitor Prescribed? No: EF ok Was EF assessed during THIS hospitalization? Yes Was Cardiac Rehab II ordered? (Included Medically managed Patients): Yes   Outstanding Labs/Studies   FLP/LFTs in 6 weeks if tolerating statin. BMET at follow up appt.   Duration of Discharge Encounter   Greater than 30 minutes including physician time.  Signed, Laverda Page NP-C 08/02/2017, 10:41 AM

## 2017-08-02 NOTE — Research (Signed)
Patient seen and examined.  Aegis II trial reviewed again with patient.  Patient and family agreeable to participation.  [place] Class I.     Consent obtained   Exam  VSS.  Lungs clear.  Cor regular.  Abdomen - no obvious masses.  Extremities - no edema No gross neurologic deficits  Patient enrolled and infusion performed.   Shawnie Ponshomas Megham Dwyer, MD, Va Greater Los Angeles Healthcare SystemFACC, [facility]aFSCAI

## 2017-08-02 NOTE — Telephone Encounter (Signed)
**Note De-identified Katriana Dortch Obfuscation** The pt is being discharged today. Will call later this week. 

## 2017-08-02 NOTE — Research (Signed)
AEGIS II Informed Consent   Subject Name: Matthew Walker  Subject met inclusion and exclusion criteria.  The informed consent form, study requirements and expectations were reviewed with the subject and questions and concerns were addressed prior to the signing of the consent form.  The subject verbalized understanding of the trail requirements.  The subject agreed to participate in the AEGIS II trial and signed the informed consent.  The informed consent was obtained prior to performance of any protocol-specific procedures for the subject.  A copy of the signed informed consent was given to the subject and a copy was placed in the subject's medical record.  Hedrick,Noelly Lasseigne W 08/02/2017, 08:35

## 2017-08-02 NOTE — Telephone Encounter (Signed)
New message   TOC per Brigitte PulseLendsey Roberts    08/16/2017    Time: 9:00 AM    Visit Type: OFFICE VISIT 30 [368]    Provider: Manson PasseyBhagat, Bhavinkumar, PA

## 2017-08-02 NOTE — Progress Notes (Signed)
Spoke to patient at the bedside. He states he has insurance and medication coverage. Advised patient to stop by admitting to upate his file with current coverage prior to DC. No other CM needs at this time.

## 2017-08-03 ENCOUNTER — Telehealth (HOSPITAL_COMMUNITY): Payer: Self-pay

## 2017-08-03 NOTE — Telephone Encounter (Signed)
Patients insurance is active and benefits verified through [employer] - No co-pay, deductible amount of $350.00/$13.32 has been met, out of pocket amount of $5,000/$77.32 has been met, 15% co-insurance, and no pre-authorization is required. Passport/reference [id number]  Will contact patient to see if interested in CR. If patient is interested, follow up appt needs to be completed. Once completed, patient will be contacted for scheduling upon review by the RN Navigator.

## 2017-08-04 ENCOUNTER — Telehealth (HOSPITAL_COMMUNITY): Payer: Self-pay

## 2017-08-04 NOTE — Research (Addendum)
Visit 1 and 2  Inclusions:   Y N   '[x]'$  '[]'$  Male or male at least 53 years of age  '[x]'$  '[]'$  Evidence of type I (spontaneous) MI as defined by the following:  '[x]'$  '[]'$  a. Detection of a rise and/or fall in Troponin I or T with at least 1 value about the 99% upper reference limit.  '[x]'$  '[]'$   (AND)---  Any 1 or more of the following:   '[]'$  '[]'$  - symptoms of ischemia (ie, resulting from a primary coronary    artery event)  '[x]'$  '[]'$  - New or presumably new significant ST/T wave changes or left bundle branch block.  '[]'$  '[]'$       - Development of pathological Q waves on EKG  '[]'$  '[]'$  - Imaging evidence of new loss or viable myocardium or regional wall motion abnormality.  '[]'$  '[]'$  - ID of intracoronary thrombus by angiography.  '[x]'$  '[]'$  No suspicion of acute kidney injury at least 12 hours after angiography OR after first medical contract for subject's not undergoing angiography There must be documented evidence of stable renal function defined as no more than an increase in Serum Creatinine < 0.'3mg'$ /dl from pre-contrast serum creatinine value.  (Before _0.76_   12 hrs after _0.79_)  '[x]'$  '[]'$  Evidence of multi-vessel coronary artery disease defined as:  '[x]'$  '[]'$  A. At least 50% stenosis on >1 epicardial artery or left main artery on catherization performed during the index hospitalization.  '[]'$  '[]'$  B. Prior cardiac catherization with at least 50% stenosis on >1 epicardial artery or left main artery.  '[]'$  '[]'$  C. Prior PCI and evidence of at least 50% stenosis of at least 1 epicardial artery different from prior revascularized artery.  '[]'$  '[]'$  D. Prior multivessel coronary artery bypass grafting.  '[x]'$  '[]'$  At least 1 of the following established risk factors:  '[]'$  '[]'$  o Age ? 65 years  '[]'$  '[]'$  o Prior history of MI  '[x]'$  '[]'$  o On pharmacological treatment for diabetes mellitus  '[]'$  '[]'$  o Peripheral arterial disease defined as meeting at least 1 of the following criteria:  '[]'$  '[]'$          +   Current intermittent claudication or resting limb  ischemia and ABI    ?0.90  '[]'$  '[]'$          +   History of peripheral revascularization (surgical or percutaneous)  '[]'$  '[]'$          +  History of limb amputation due to PAD  '[]'$  '[]'$          +  Angiographic evidence (using computed tomographic angiography, MRA, or invasive angiography or a peripheral artery stenosis ?50%.  '[]'$  '[]'$  If the male subject without child bearing potential, not breastfeeding, not pregnant, and if of child bearing potential agree to contraception or lifestyle methods to avoid pregnancy? Child-bearing potential (must select all)   ____ not pregnant (by urine or serum hCG AND   ____ willing to use an acceptable method of contraception to avoid pregnancy during the study and for 3 months after last dose of investional product (refer to acceptable methods per protocol)   ____ if breastfeeding, willing to cease breastfeeding Date of pregnancy test: (____ /_____/ ________)  Result  - or + Not of Child bearing potential (select one)   ____ Age >= 10   ____ Age 60-60 with amenorrhea for at least 1 year with documented evidence of follicle-stimulating hormone level >40 IU/L   ____ Surgically sterile for at least 3  months prior to randomization  '[x]'$  '[]'$  Investigator believes that the subject is willing and able to adhere to all protocol requirements.   '[x]'$  '[]'$  Willing to not participate in another investigational study until completion of their final study visit.     Exclusions:  Y N   '[]'$  '[x]'$  If these are the reason for MI (pt is excluded)  '[]'$  '[x]'$  1. Myocardial necrosis due mismatch between myocardial oxygen demand and supply, usually due to fixed coronary disease with increased demand leading to MI  '[]'$  '[x]'$  2. Cardiac death due to MI  '[]'$  '[x]'$  3. Myocardial necrosis due to complications from a PCI  '[]'$  '[x]'$  4. Myocardial necrosis due to stent thrombosis  '[]'$  '[x]'$  5. Myocardial necrosis due to in stent restenosis as the only etiology  '[]'$  '[x]'$  6. Myocardial necrosis in the stenting of coronary  artery bypass grafting  '[]'$  '[x]'$  Ongoing hemodynamic instability  '[]'$  '[x]'$       +  History of NYHA Class III or IV heart failure within the last year  '[]'$  '[x]'$       +  Killip Class III or IV heart failure  '[]'$  '[x]'$       +  Sustained and/or symptomatic hypotension (SBP <90 mm HG)  '[]'$  '[x]'$       +  Known left ventricular ejection fraction of <30%  '[]'$  '[x]'$  Evidence of hepatobiliary disease as indicated by any 1 or more of the   following at screening:  '[]'$  '[x]'$       +  Current active hepatic dysfunction or active biliary obstruction  '[]'$  '[x]'$       +       +  Chronic or prior history of cirrhosis or of infectious / inflammatory hepatitis NOTE: If a patient has a medical history of recovered Hep A, B, or C without evidence of cirrhosis, he/she could be considered for inclusion if there is documented evidence that there is no active infection (ie, antigen negative)  '[]'$  '[x]'$       + Hepatic lab abnormalities: ALT > 3 x ULN or Total bilirubin > 2x ULN at randomization.  '[]'$  '[x]'$  Severe chronic kidney disease (eGFR of <85m) or on dialysis  '[]'$  '[x]'$  Plan to undergo scheduled coronary artery bypass graft surgery after randomization, as determined at the time of screening  '[]'$  '[x]'$  Known history of allergies to soybeans, peanuts, albumin  '[]'$  '[x]'$  Body weight <50 kg  '[]'$  '[x]'$  A known history of IgA deficiency or antibodies to IgA  '[]'$  '[x]'$  A comorbid condition with an estimated life expectancy of ? 6 months at time of consent  '[]'$  '[x]'$  Women who are pregnant or breastfeeding at time of randomization  '[]'$  '[x]'$  Participated in another interventional clinical study at the time of consent  '[]'$  '[x]'$  Treatment with anticancer therapy  '[]'$  '[x]'$  Previously randomized or participated in this study or previously exposed to CSL112   Health Questionnaire  English version for the [employer]   By placing a checkmark in one box in each group below, please indicate which statements best describe your own health state today.     Mobility   I have no problems  in walking about ?  I have some problems in walking about ?  I am confined to bed ?     Self-Care   I have no problems with self-care X  I have some problems washing or dressing myself ?  I am unable to wash or dress myself ?  Usual Activities (e.g. work, study, housework, family or leisure activities)   I have no problems with performing my usual activities X  I have some problems with performing my usual activities ?  I am unable to perform my usual activities ?     Pain / Discomfort   I have no pain or discomfort X  I have moderate pain or discomfort ?  I have extreme pain or discomfort ?     Anxiety / Depression   I am not anxious or depressed X  I am moderately anxious or depressed ?  I am extremely anxious or depressed ?   To help people say how good or bad a health state is, we have drawn a scale (rather like a thermometer) on which the best state you can imagine is marked 100 and the worst state you can imagine is marked 0.    We would like you to indicate on this scale how good or bad your own health is today, in your opinion. Please do this by drawing a line from the box below to whichever point on the scale indicates how good or bad your health state is today.   88   Local Labs:  Component     Latest Ref Rng & Units 07/30/2017  Drawn by cath lab @ [id number] pre cath labs       Sodium     135 - 145 mmol/L 137  Potassium     3.5 - 5.1 mmol/L 3.7  Chloride     101 - 111 mmol/L 102  CO2     22 - 32 mmol/L 21 (L)  Glucose     65 - 99 mg/dL 238 (H)  BUN     6 - 20 mg/dL 12  Creatinine     0.61 - 1.24 mg/dL 0.76  Calcium     8.9 - 10.3 mg/dL 9.2  Total Protein     6.5 - 8.1 g/dL 6.2 (L)  Albumin     3.5 - 5.0 g/dL 4.0  AST     15 - 41 U/L 25  ALT     17 - 63 U/L 35  Alkaline Phosphatase     38 - 126 U/L 72  Total Bilirubin     0.3 - 1.2 mg/dL 1.3 (H)  GFR, Est Non African American     >60 mL/min >60  GFR, Est African American     >60 mL/min >60  Anion  gap     5 - 15 14   Component     Latest Ref Rng & Units 08/02/2017     Post 12 hour cath labs 02:49  Sodium     135 - 145 mmol/L 136  Potassium     3.5 - 5.1 mmol/L 3.7  Chloride     101 - 111 mmol/L 106  CO2     22 - 32 mmol/L 21 (L)  Glucose     65 - 99 mg/dL 258 (H)  BUN     6 - 20 mg/dL 11  Creatinine     0.61 - 1.24 mg/dL 0.79  Calcium     8.9 - 10.3 mg/dL 8.5 (L)  Total Protein     6.5 - 8.1 g/dL   Albumin     3.5 - 5.0 g/dL   AST     15 - 41 U/L   ALT     17 - 63 U/L   Alkaline Phosphatase  38 - 126 U/L   Total Bilirubin     0.3 - 1.2 mg/dL   GFR, Est Non African American     >60 mL/min >60  GFR, Est African American     >60 mL/min >60  Anion gap     5 - 15 9    DEMOGRAPHICS:  Patient Name: Matthew Walker Birth Date: 12-Nov-1965  Sex: Male  Race: white  Child Bearing: ? Yes    ? No ? Tubial ligation ? Hysterectomy  ? postmenopausal   Height: 178 cm Weight: 80 kg   Index Procedure:  Onset date of symptoms: 5-Apr-19 Onset of symptoms: 2100  Date of First contact at hospital: 6-Apr-19 Time of first contact at hospital: 2130  Admission Date: 6-Apr-19   Discharge Date: 9-Apr-19 Discharge Time: 1439   Vital Signs: Date 07/30/2017    Time: 0020 BP: 178/108  Pulse: 80    Concomitant medications: Every visit: ? See med sheet  BMP Pre Contrast IV 07/30/17 @ 0027  CMP Post Contrast IV 12 hours later: 08/02/17 @ 0249 Hepatic Panel:  ALT: 35 Total Bili: 1.3 Direct Bili: Click or tap here to enter text.   Medical History:  ? CAD ? Prior MI ? PAD  ? History of Heart Failure ? Moderate to severe valvular dx ? AFib  ? Prior Coronary Revascularization  if YES please select Yes or No below:  CABG ? Yes   ? No           PCI with stent ? Yes   ? No          PCI without stent ? Yes ? No  ? CVA if checked please select one of the following Choose an item.  ? Hypertension  ? Gilberts syndrome ? CKD   ? Hypocholesteremia ? DM ? Smoker        ? eCigarette  Killip Class  Stage 1     EQ-5D-3L ?  Future Biomedical Research: Consented ? Yes     ? No If no please date they withdrew consent from biomedical research Click or tap to enter a date.  Central Labs Before Start of Infusion: ? Biochemistry panel      ? Hematology     ? Immunogenicity   (30 mins before infusion)   ? Parvovirus  ? FBR sample ? PK/PD sample Central Labs End of Infusion:   ? PK/PD Central Blood Draw Time: Before SOI: 08/02/17 @ 1035_ After EOI: 08/02/17 @ 1237_ Infusion Start Time: 08/02/2017 10:39 AM  Infusion End Time: 08/02/2017 12:30 PM

## 2017-08-04 NOTE — Telephone Encounter (Signed)
Called patient to see if he is interested in the Cardiac Rehab Program - Patient stated he is interested. Explained scheduling process to patient and patient stated he understands. Went over insurance with patient and patient verbally stated he understands what he is responsible for. Will contact patient for scheduling once follow up appt has been completed.

## 2017-08-04 NOTE — Research (Addendum)
Visit 2 infusion 1  Infusion tolerated well. No complaints of chest pain or sob. Will see patient back next Tuesday for infusion 2.                                    "CONSENT"   YES     NO   Continuing further Investigational Product and study visits for follow-up? [x]  []   Continuing consent from future biomedical research [x]  []                                      "EVENTS"    YES     NO  AE   (IF YES SEE SOURCE) []  [x]   SAE  (IF YES SEE SOURCE) []  [x]   ENDPOINT   (IF YES SEE SOURCE) []  [x]   REVASCULARIZATION  (IF YES SEE SOURCE) []  [x]   AMPUTATION   (IF YES SEE SOURCE) []  [x]   TROPONIN'S  (IF YES SEE SOURCE) []  [x]    Central labs drawn today:      Allergies as of 08/02/2017      Reactions   Bee Venom Other (See Comments)   Hay Fever      Medication List    STOP taking these medications   amLODipine 10 MG tablet Commonly known as:  NORVASC   ibuprofen 200 MG tablet Commonly known as:  ADVIL,MOTRIN   pravastatin 10 MG tablet Commonly known as:  PRAVACHOL     TAKE these medications   aspirin 81 MG chewable tablet Chew 1 tablet (81 mg total) by mouth daily.   atorvastatin 80 MG tablet Commonly known as:  LIPITOR Take 1 tablet (80 mg total) by mouth daily at 6 PM.   clopidogrel 75 MG tablet Commonly known as:  PLAVIX Take 1 tablet (75 mg total) by mouth daily with breakfast.   EMERGEN-C IMMUNE PO Take 1 packet by mouth daily.   fexofenadine 60 MG tablet Commonly known as:  ALLEGRA Take 60 mg by mouth daily as needed for allergies or rhinitis.   fluticasone 50 MCG/ACT nasal spray Commonly known as:  FLONASE Place 1 spray into both nostrils as needed for congestion.   lisinopril-hydrochlorothiazide 20-25 MG tablet Commonly known as:  PRINZIDE,ZESTORETIC Take 0.5 tablets by mouth daily. What changed:  how much to take   metFORMIN 500 MG 24 hr tablet Commonly known as:  GLUCOPHAGE-XR Take 1,000 mg by mouth 2 (two) times daily.   metoprolol tartrate 50  MG tablet Commonly known as:  LOPRESSOR Take 1 tablet (50 mg total) by mouth 2 (two) times daily.   nitroGLYCERIN 0.4 MG SL tablet Commonly known as:  NITROSTAT Place 1 tablet (0.4 mg total) under the tongue every 5 (five) minutes as needed.   sodium chloride 0.65 % Soln nasal spray Commonly known as:  OCEAN Place 1 spray into both nostrils as needed for congestion.   TRAVEL SICKNESS 25 MG Chew Generic drug:  Meclizine HCl Chew 25 mg by mouth as needed for dizziness.

## 2017-08-05 NOTE — Telephone Encounter (Signed)
Left message for patient to return [facility] at [facility]:00 with the PA

## 2017-08-08 ENCOUNTER — Other Ambulatory Visit (HOSPITAL_COMMUNITY): Payer: Self-pay | Admitting: *Deleted

## 2017-08-09 ENCOUNTER — Encounter: Payer: Federal, State, Local not specified - PPO | Admitting: *Deleted

## 2017-08-09 ENCOUNTER — Ambulatory Visit (HOSPITAL_COMMUNITY)
Admit: 2017-08-09 | Discharge: 2017-08-09 | Disposition: A | Discharge status: Home / Self Care | Payer: Federal, State, Local not specified - PPO | Attending: Internal Medicine | Admitting: Internal Medicine

## 2017-08-09 VITALS — BP 147/85 | HR 65

## 2017-08-09 VITALS — BP 147/85 | HR 65 | Temp 97.8°F | Resp 18

## 2017-08-09 DIAGNOSIS — Z006 Encounter for examination for normal comparison and control in clinical research program: Secondary | ICD-10-CM

## 2017-08-09 LAB — BASIC METABOLIC PANEL
Anion gap: 9 (ref 5–15)
BUN: 15 mg/dL (ref 6–20)
CO2: 21 mmol/L — ABNORMAL LOW (ref 22–32)
Calcium: 9.4 mg/dL (ref 8.9–10.3)
Chloride: 105 mmol/L (ref 101–111)
Creatinine, Ser: 0.75 mg/dL (ref 0.61–1.24)
GFR calc Af Amer: >60 mL/min (ref >60)
GFR calc non Af Amer: >60 mL/min (ref >60)
Glucose, Bld: 280 mg/dL — ABNORMAL HIGH (ref 65–99)
Potassium: 3.9 mmol/L (ref 3.5–5.1)
Sodium: 135 mmol/L (ref 135–145)

## 2017-08-09 MED ORDER — STUDY - AEGIS II STUDY - PLACEBO OR CSL112 (PI-HILTY)
170.0000 mL | INTRAVENOUS | Status: DC
  Administered 2017-08-09: 170 mL via INTRAVENOUS
  Filled 2017-08-09: qty 170

## 2017-08-09 NOTE — Telephone Encounter (Signed)
**Note De-identified Matthew Walker Obfuscation** LMTCB

## 2017-08-09 NOTE — Progress Notes (Signed)
Visit 3 Infusion 2   Patient c/o chest discomfort 08/08/17 while working in yard, only lasted a minute. He did not take any ntg, Also states he had some SOB with walking, he has signed up for cardiac rehab. States he also woke up with bruise on top lip.  Denies any changes in his medication. Communicated above with Dr. Riley KillStuckey. Will continue to monitor patient.                                     "CONSENT"   YES     NO   Continuing further Investigational Product and study visits for follow-up? [x]  []   Continuing consent from future biomedical research [x]  []                                      "EVENTS"    YES     NO  AE   (IF YES SEE SOURCE) [x]  []   SAE  (IF YES SEE SOURCE) []  [x]   ENDPOINT   (IF YES SEE SOURCE) []  [x]   REVASCULARIZATION  (IF YES SEE SOURCE) []  [x]   AMPUTATION   (IF YES SEE SOURCE) []  [x]   TROPONIN'S  (IF YES SEE SOURCE) []  [x]    Central labs drawn at this visit.

## 2017-08-10 DIAGNOSIS — I10 Essential (primary) hypertension: Secondary | ICD-10-CM | POA: Diagnosis not present

## 2017-08-10 DIAGNOSIS — E782 Mixed hyperlipidemia: Secondary | ICD-10-CM | POA: Diagnosis not present

## 2017-08-10 DIAGNOSIS — I214 Non-ST elevation (NSTEMI) myocardial infarction: Secondary | ICD-10-CM | POA: Diagnosis not present

## 2017-08-10 DIAGNOSIS — E1165 Type 2 diabetes mellitus with hyperglycemia: Secondary | ICD-10-CM | POA: Diagnosis not present

## 2017-08-15 ENCOUNTER — Other Ambulatory Visit (HOSPITAL_COMMUNITY): Payer: Self-pay | Admitting: *Deleted

## 2017-08-15 NOTE — Progress Notes (Signed)
Cardiology Office Note    Date:  08/16/2017   ID:  Matthew Walker, Matthew Walker 1965/12/02, MRN [id number]  PCP:  Estanislado Pandy, MD  Cardiologist: Dr. Katrinka Blazing   Chief Complaint: Hospital follow up  History of Present Illness:   Matthew Walker is a 52 y.o. male with hx of HTN, DM, HLD and recent STEMI presents for follow up.   Transferred from [facility] with STEMI. Underwent cardiac cath with PCI/DES x1 to the mLAD. Underwent staged intervention to the LCx and RCA with Dr. Herbie Baltimore on 08/01/17. Echo showed normal EF of 60-65% with no WMA, and G2DD. No complications noted.  Hgb A1c noted at 10.6. Was on metformin prior to admission, but needed diet education and seen by the diabetes coordinator. Trop peaked at 11.17. Smoking cessation discussed but he reported not being willing to commit at this time.  Plan to have him resume his lisinopril/HCTZ combination at discharge but at half dose. Worked well with cardiac rehab without recurrent chest pain. He is enrolled in [employer] II research study.   Here today for follow up. He walks about 30 minutes and did weed eating without chest pain or dyspnea. However had few episode of sharp left sided chest pain in evening. Resolved spontaneously in less than 1 minutes. Did not require any sublingual nitroglycerin.  His pain is different than STEMI presentation.  Not typical of acid reflux.  He denies orthopnea, PND, dizziness, palpitation, melena, blood in his stool or urine.  He works in [employer].  Wants to go back to work.  Has seen PCP Metformin.  He will work on diet and exercise.  Past Medical History:  Diagnosis Date  . Arthritis    "knees" (08/01/2017)  . Coronary artery disease   . GERD (gastroesophageal reflux disease)   . High cholesterol   . Hypertension   . STEMI (ST elevation myocardial infarction) (HCC) 07/30/2017   PCI/DES x1 to mLAD with staged intervention to the pRCA, and Lcx. Normal EF  . Type II diabetes mellitus (HCC)     Past  Surgical History:  Procedure Laterality Date  . CORONARY STENT INTERVENTION N/A 07/30/2017   Procedure: CORONARY STENT INTERVENTION;  Surgeon: Lyn Records, MD;  Location: [facility] INVASIVE CV LAB;  Service: Cardiovascular;  Laterality: N/A;  . CORONARY STENT INTERVENTION N/A 08/01/2017   Procedure: CORONARY STENT INTERVENTION;  Surgeon: Marykay Lex, MD;  Location: [facility] INVASIVE CV LAB;  Service: Cardiovascular;  Laterality: N/A;  . CORONARY/GRAFT ACUTE MI REVASCULARIZATION N/A 07/30/2017   Procedure: Coronary/Graft Acute MI Revascularization;  Surgeon: Lyn Records, MD;  Location: [facility] INVASIVE CV LAB;  Service: Cardiovascular;  Laterality: N/A;  . INGUINAL HERNIA REPAIR Bilateral   . KNEE ARTHROSCOPY Right   . LEFT HEART CATH AND CORONARY ANGIOGRAPHY N/A 07/30/2017   Procedure: LEFT HEART CATH AND CORONARY ANGIOGRAPHY;  Surgeon: Lyn Records, MD;  Location: [facility] INVASIVE CV LAB;  Service: Cardiovascular;  Laterality: N/A;  . LEFT HEART CATH AND CORONARY ANGIOGRAPHY N/A 08/01/2017   Procedure: LEFT HEART CATH AND CORONARY ANGIOGRAPHY;  Surgeon: Marykay Lex, MD;  Location: [facility] INVASIVE CV LAB;  Service: Cardiovascular;  Laterality: N/A;    Current Medications: Prior to Admission medications   Medication Sig Start Date End Date Taking? Authorizing Provider  aspirin 81 MG chewable tablet Chew 1 tablet (81 mg total) by mouth daily. 08/02/17   Arty Baumgartner, NP  atorvastatin (LIPITOR) 80 MG tablet Take 1 tablet (80 mg total)  by mouth daily at 6 PM. 08/02/17   Arty Baumgartner, NP  clopidogrel (PLAVIX) 75 MG tablet Take 1 tablet (75 mg total) by mouth daily with breakfast. 08/02/17   Arty Baumgartner, NP  fexofenadine (ALLEGRA) 60 MG tablet Take 60 mg by mouth daily as needed for allergies or rhinitis.    [provider]  fluticasone (FLONASE) 50 MCG/ACT nasal spray Place 1 spray into both nostrils as needed for congestion. 07/25/17   [provider]  lisinopril-hydrochlorothiazide  (PRINZIDE,ZESTORETIC) 20-25 MG tablet Take 0.5 tablets by mouth daily. 08/02/17   Arty Baumgartner, NP  metFORMIN (GLUCOPHAGE-XR) 500 MG 24 hr tablet Take 1,000 mg by mouth 2 (two) times daily. 07/18/17   [provider]  metoprolol tartrate (LOPRESSOR) 50 MG tablet Take 1 tablet (50 mg total) by mouth 2 (two) times daily. 08/02/17   Arty Baumgartner, NP  Multiple Vitamins-Minerals (EMERGEN-C IMMUNE PO) Take 1 packet by mouth daily.    [provider]  nitroGLYCERIN (NITROSTAT) 0.4 MG SL tablet Place 1 tablet (0.4 mg total) under the tongue every 5 (five) minutes as needed. 08/02/17   Arty Baumgartner, NP  sodium chloride (OCEAN) 0.65 % SOLN nasal spray Place 1 spray into both nostrils as needed for congestion.    [provider]  TRAVEL SICKNESS 25 MG CHEW Chew 25 mg by mouth as needed for dizziness. 05/03/17   [provider]    Allergies:   Bee venom   Social History   Socioeconomic History  . Marital status: Married    Spouse name: Not on file  . Number of children: Not on file  . Years of education: Not on file  . Highest education level: Not on file  Occupational History  . Not on file  Social Needs  . Financial resource strain: Not on file  . Food insecurity:    Worry: Not on file    Inability: Not on file  . Transportation needs:    Medical: Not on file    Non-medical: Not on file  Tobacco Use  . Smoking status: Former Smoker    Packs/day: 1.00    Years: 36.00    Pack years: 36.00    Types: Cigarettes    Last attempt to quit: 07/29/2017    Years since quitting: 0.0  . Smokeless tobacco: Never Used  Substance and Sexual Activity  . Alcohol use: Not Currently  . Drug use: Not Currently  . Sexual activity: Not on file  Lifestyle  . Physical activity:    Days per week: Not on file    Minutes per session: Not on file  . Stress: Not on file  Relationships  . Social connections:    Talks on phone: Not on file    Gets together: Not on  file    Attends religious service: Not on file    Active member of club or organization: Not on file    Attends meetings of clubs or organizations: Not on file    Relationship status: Not on file  Other Topics Concern  . Not on file  Social History Narrative  . Not on file     Family History:  The patient's family history is not on file.   ROS:   Please see the history of present illness.    ROS All other systems reviewed and are negative.   PHYSICAL EXAM:   VS:  BP 130/90   Pulse 60   Ht 5\' 10"  (1.778  m)   Wt 182 lb 9.6 oz (82.8 kg)   SpO2 96%   BMI 26.20 kg/m    GEN: Well nourished, well developed, in no acute distress  HEENT: normal  Neck: no JVD, carotid bruits, or masses Cardiac: Groin and radial cath site looks good,  RRR; no murmurs, rubs, or gallops,no edema.  Respiratory:  clear to auscultation bilaterally, normal work of breathing GI: soft, nontender, nondistended, + BS MS: no deformity or atrophy  Skin: warm and dry, no rash Neuro:  Alert and Oriented x 3, Strength and sensation are intact Psych: euthymic mood, full affect  Wt Readings from Last 3 Encounters:  08/16/17 182 lb 9.6 oz (82.8 kg)  08/02/17 176 lb 5.9 oz (80 kg)      Studies/Labs Reviewed:   EKG:  EKG is ordered today.  The ekg ordered today demonstrates sinus rhythm at rate of 60-minute.  T wave inversion anterior   Laterally.  Resolved/improved T wave inversion inferiorly.  Recent Labs: 07/30/2017: ALT 35 08/02/2017: Hemoglobin 15.1; Platelets 131 08/09/2017: BUN 15; Creatinine, Ser 0.75; Potassium 3.9; Sodium 135   Lipid Panel    Component Value Date/Time   CHOL 168 07/30/2017 0034   TRIG 56 07/30/2017 0034   HDL 38 (L) 07/30/2017 0034   CHOLHDL 4.4 07/30/2017 0034   VLDL 11 07/30/2017 0034   LDLCALC 119 (H) 07/30/2017 0034    Additional studies/ records that were reviewed today include:   Cath: 07/30/17  Conclusion    Technically difficult procedure due to tortuosity in the  right innominate artery preventing catheter control especially when attempting to engage the right coronary. This led to crossover to femoral approach. These difficulties prolong the time to reperfusion.  Severe three-vessel coronary disease with 80-90% proximal RCA stenosis. RCA is supplying collaterals to the LAD.  25% distal left main narrowing.  95% stenosis in the proximal to mid circumflex within a tortuous segment.  Total occlusion of the mid LAD collateralized from the right coronary with thrombus staining. The lesion is felt to represent the culprit for the patient's presentation. The LAD was successfully treated with angioplasty and stenting using a 28 x 2.75 mm Synergy stent reducing the 100% stenosis to less than 10% with TIMI grade III flow. Reperfusion arrhythmias and transient increase in chest discomfort was noted.  Normal left ventricular end-diastolic pressure. Left ventricular function was not assessed.  RECOMMENDATIONS:   2D Doppler echocardiogram to fully assess LV function and aortic root size (suspected to be somewhat dilated based upon experience during the procedure).  Aggressive risk factor modification with high intensity statin therapy, smoking cessation, blood pressure control, and glycemic control.  Staged PCI on the circumflex and right coronary prior to discharge. This needs to be discussed among the interventional team.   TTE: 07/31/17  Study Conclusions  - Left ventricle: The cavity size was normal. Wall thickness was increased in a pattern of mild LVH. There was focal basal hypertrophy. Systolic function was normal. The estimated ejection fraction was in the range of 60% to 65%. Wall motion was normal; there were no regional wall motion abnormalities. Features are consistent with a pseudonormal left ventricular filling pattern, with concomitant abnormal relaxation and increased filling pressure (grade 2 diastolic  dysfunction). - Left atrium: The atrium was moderately dilated.  Impressions:  - Normal LV systolic function Grade 2 diastolic dysfunction  Cath: 08/01/17  Conclusion     Lesion #1: Prox RCA 90% stenosed  A drug-eluting stent was successfully placed using a  STENT SYNERGY DES 3.5X16. --> Postdilated to 4.1 mm  Post intervention, there is a 0% residual stenosis.  Prox Cx to Mid Cx lesion is 95% stenosed.  2 Overlapping Drug-Eluting Stents were successfully placed using a STENT SYNERGY DES 3.5X16 with an overlapping STENT SYNERGY DES 3.5X12. - post-dilated to 4.1 mm  Post intervention, there is a 0% residual stenosis.  Patent LAD stent with stable Left Main and proximal LAD as well as OM 2 disease. Also distal RCA 50% lesion is stable as well.  LV end diastolic pressure is normal.  Successful 2 vessel DES PCI -of proximal RCA and Prox-Mid Cx.  The patient was given supplemental glycerin post PCI for mild chest pain.  Mild oozing from the sheath insertion site.  Plan: Transfer to 6 C Post-Procedure Unit Continue DAPT.  Continue RF modification per Dr. Katrinka BlazingSmith.  Anticipate that he should be ready for discharge as early as tomorrow.  Bryan Lemmaavid Harding, MD  _____________   ASSESSMENT & PLAN:    1. STEMI/CAD s/p PCI with DES to mLAD, prox-mid LCX and RCA -Mild chest discomfort.  Seems atypical.  He walks and does weed eating without any limitation.  Continue -Aspirin, Plavix, Lipitor, beta-blocker and lisinopril.  2. HTN -Stable on current medication.  3. HLD - 07/30/2017: Cholesterol 168; HDL 38; LDL Cholesterol 119; Triglycerides 56; VLDL 11  - Continue statin. He is enrolled in  Aegis II research study.   4. DM - per PCP  Medication Adjustments/Labs and Tests Ordered: Current medicines are reviewed at length with the patient today.  Concerns regarding medicines are outlined above.  Medication changes, Labs and Tests ordered today are listed in the Patient  Instructions below. Patient Instructions  Medication Instructions:  1. Your physician recommends that you continue on your current medications as directed. Please refer to the Current Medication list given to you today.   Labwork: NONE ORDERED TODAY  Testing/Procedures: NONE ORDERED TODAY  Follow-Up: DR. Katrinka BlazingSMITH   Any Other Special Instructions Will Be Listed Below (If Applicable).  YOU HAVE BEEN GIVEN A WORK NOTE TODAY   If you need a refill on your cardiac medications before your next appointment, please call your pharmacy.      Lorelei PontSigned, Eldor Conaway, [place]  08/16/2017 9:29 AM    [facility] [address], [place], [place]  [id number] Phone: [phone]; Fax: [phone]

## 2017-08-16 ENCOUNTER — Ambulatory Visit (HOSPITAL_COMMUNITY)
Admit: 2017-08-16 | Discharge: 2017-08-16 | Disposition: A | Discharge status: Home / Self Care | Payer: Federal, State, Local not specified - PPO | Attending: Internal Medicine | Admitting: Internal Medicine

## 2017-08-16 ENCOUNTER — Telehealth: Payer: Self-pay | Admitting: *Deleted

## 2017-08-16 ENCOUNTER — Encounter: Payer: Self-pay | Admitting: *Deleted

## 2017-08-16 ENCOUNTER — Encounter: Payer: Federal, State, Local not specified - PPO | Admitting: *Deleted

## 2017-08-16 ENCOUNTER — Encounter: Payer: Self-pay | Admitting: Physician Assistant

## 2017-08-16 ENCOUNTER — Ambulatory Visit: Payer: Federal, State, Local not specified - PPO | Admitting: Physician Assistant

## 2017-08-16 VITALS — BP 130/90 | HR 60 | Ht 70.0 in | Wt 182.6 lb

## 2017-08-16 VITALS — BP 141/96 | HR 62

## 2017-08-16 DIAGNOSIS — Z006 Encounter for examination for normal comparison and control in clinical research program: Secondary | ICD-10-CM

## 2017-08-16 DIAGNOSIS — I25119 Atherosclerotic heart disease of native coronary artery with unspecified angina pectoris: Secondary | ICD-10-CM

## 2017-08-16 DIAGNOSIS — I10 Essential (primary) hypertension: Secondary | ICD-10-CM

## 2017-08-16 DIAGNOSIS — I2101 ST elevation (STEMI) myocardial infarction involving left main coronary artery: Secondary | ICD-10-CM

## 2017-08-16 DIAGNOSIS — E785 Hyperlipidemia, unspecified: Secondary | ICD-10-CM

## 2017-08-16 DIAGNOSIS — E119 Type 2 diabetes mellitus without complications: Secondary | ICD-10-CM

## 2017-08-16 MED ORDER — STUDY - AEGIS II STUDY - PLACEBO OR CSL112 (PI-HILTY)
170.0000 mL | INTRAVENOUS | Status: DC
  Administered 2017-08-16: 170 mL via INTRAVENOUS
  Filled 2017-08-16: qty 170

## 2017-08-16 NOTE — Patient Instructions (Addendum)
Medication Instructions:  1. Your physician recommends that you continue on your current medications as directed. Please refer to the Current Medication list given to you today.   Labwork: NONE ORDERED TODAY  Testing/Procedures: NONE ORDERED TODAY  Follow-Up: DR. Katrinka BlazingSMITH ON 12/09/17 @ 4:20 PM  Any Other Special Instructions Will Be Listed Below (If Applicable).  YOU HAVE BEEN GIVEN A WORK NOTE TODAY   If you need a refill on your cardiac medications before your next appointment, please call your pharmacy.

## 2017-08-16 NOTE — Telephone Encounter (Signed)
Lmom FMLA paperwork ready for pick up at the front desk.

## 2017-08-16 NOTE — Progress Notes (Signed)
Visit 4 infusion 3  Pt doing great. No cp or sob.  Going back to work tomorrow.  No med change.   Current Outpatient Medications:  .  aspirin 81 MG chewable tablet, Chew 1 tablet (81 mg total) by mouth daily., Disp: , Rfl:  .  atorvastatin (LIPITOR) 80 MG tablet, Take 1 tablet (80 mg total) by mouth daily at 6 PM., Disp: 90 tablet, Rfl: 0 .  clopidogrel (PLAVIX) 75 MG tablet, Take 1 tablet (75 mg total) by mouth daily with breakfast., Disp: 90 tablet, Rfl: 2 .  fexofenadine (ALLEGRA) 60 MG tablet, Take 60 mg by mouth daily as needed for allergies or rhinitis., Disp: , Rfl:  .  fluticasone (FLONASE) 50 MCG/ACT nasal spray, Place 1 spray into both nostrils as needed for congestion., Disp: , Rfl: 12 .  lisinopril-hydrochlorothiazide (PRINZIDE,ZESTORETIC) 20-25 MG tablet, Take 0.5 tablets by mouth daily., Disp: 30 tablet, Rfl: 0 .  metFORMIN (GLUCOPHAGE-XR) 500 MG 24 hr tablet, Take 1,000 mg by mouth 2 (two) times daily., Disp: , Rfl: 3 .  metoprolol tartrate (LOPRESSOR) 50 MG tablet, Take 1 tablet (50 mg total) by mouth 2 (two) times daily., Disp: 60 tablet, Rfl: 1 .  Multiple Vitamins-Minerals (EMERGEN-C IMMUNE PO), Take 1 packet by mouth daily., Disp: , Rfl:  .  sodium chloride (OCEAN) 0.65 % SOLN nasal spray, Place 1 spray into both nostrils as needed for congestion., Disp: , Rfl:  .  TRAVEL SICKNESS 25 MG CHEW, Chew 25 mg by mouth as needed for dizziness., Disp: , Rfl: 1 .  nitroGLYCERIN (NITROSTAT) 0.4 MG SL tablet, Place 1 tablet (0.4 mg total) under the tongue every 5 (five) minutes as needed. (Patient not taking: Reported on 08/16/2017), Disp: 25 tablet, Rfl: 2 No current facility-administered medications for this visit.   Facility-Administered Medications Ordered in Other Visits:  .  AEGIS II Study - Placebo or CSL112 (PI-Hilty), 170 mL, Intravenous, Q7 days, Hilty, Lisette AbuKenneth C, MD, 170 mL at 08/16/17 1125                                     "CONSENT"   YES     NO   Continuing further  Investigational Product and study visits for follow-up? [x]  []   Continuing consent from future biomedical research [x]  []                                      "EVENTS"    YES     NO  AE   (IF YES SEE SOURCE) []  [x]   SAE  (IF YES SEE SOURCE) []  [x]   ENDPOINT   (IF YES SEE SOURCE) []  [x]   REVASCULARIZATION  (IF YES SEE SOURCE) []  [x]   AMPUTATION   (IF YES SEE SOURCE) []  [x]   TROPONIN'S  (IF YES SEE SOURCE) []  [x]    Central Labs:

## 2017-08-23 ENCOUNTER — Encounter: Payer: Federal, State, Local not specified - PPO | Admitting: *Deleted

## 2017-08-23 ENCOUNTER — Ambulatory Visit (HOSPITAL_COMMUNITY)
Admission: RE | Admit: 2017-08-23 | Discharge: 2017-08-23 | Disposition: A | Discharge status: Home / Self Care | Payer: Federal, State, Local not specified - PPO | Source: Ambulatory Visit | Attending: Internal Medicine | Admitting: Internal Medicine

## 2017-08-23 VITALS — BP 142/98 | HR 61 | Resp 14

## 2017-08-23 DIAGNOSIS — Z006 Encounter for examination for normal comparison and control in clinical research program: Secondary | ICD-10-CM

## 2017-08-23 MED ORDER — STUDY - AEGIS II STUDY - PLACEBO OR CSL112 (PI-HILTY)
170.0000 mL | INTRAVENOUS | Status: DC
  Administered 2017-08-23: 170 mL via INTRAVENOUS
  Filled 2017-08-23: qty 170

## 2017-08-23 NOTE — Progress Notes (Signed)
Visit 5 infusion 4  Pt doing well, no cp or sob. No medication changes.  PK drawn only:    Current Outpatient Medications:  .  aspirin 81 MG chewable tablet, Chew 1 tablet (81 mg total) by mouth daily., Disp: , Rfl:  .  atorvastatin (LIPITOR) 80 MG tablet, Take 1 tablet (80 mg total) by mouth daily at 6 PM., Disp: 90 tablet, Rfl: 0 .  clopidogrel (PLAVIX) 75 MG tablet, Take 1 tablet (75 mg total) by mouth daily with breakfast., Disp: 90 tablet, Rfl: 2 .  fexofenadine (ALLEGRA) 60 MG tablet, Take 60 mg by mouth daily as needed for allergies or rhinitis., Disp: , Rfl:  .  fluticasone (FLONASE) 50 MCG/ACT nasal spray, Place 1 spray into both nostrils as needed for congestion., Disp: , Rfl: 12 .  lisinopril-hydrochlorothiazide (PRINZIDE,ZESTORETIC) 20-25 MG tablet, Take 0.5 tablets by mouth daily., Disp: 30 tablet, Rfl: 0 .  metFORMIN (GLUCOPHAGE-XR) 500 MG 24 hr tablet, Take 1,000 mg by mouth 2 (two) times daily., Disp: , Rfl: 3 .  metoprolol tartrate (LOPRESSOR) 50 MG tablet, Take 1 tablet (50 mg total) by mouth 2 (two) times daily., Disp: 60 tablet, Rfl: 1 .  Multiple Vitamins-Minerals (EMERGEN-C IMMUNE PO), Take 1 packet by mouth daily., Disp: , Rfl:  .  sodium chloride (OCEAN) 0.65 % SOLN nasal spray, Place 1 spray into both nostrils as needed for congestion., Disp: , Rfl:  .  TRAVEL SICKNESS 25 MG CHEW, Chew 25 mg by mouth as needed for dizziness., Disp: , Rfl: 1 .  nitroGLYCERIN (NITROSTAT) 0.4 MG SL tablet, Place 1 tablet (0.4 mg total) under the tongue every 5 (five) minutes as needed. (Patient not taking: Reported on 08/16/2017), Disp: 25 tablet, Rfl: 2 No current facility-administered medications for this visit.   Facility-Administered Medications Ordered in Other Visits:  .  AEGIS II Study - Placebo / CSL112 (PI-Hilty), 170 mL, Intravenous, Q7 days, Hilty, Lisette Abu, MD, 170 mL at 08/23/17 1234                                    "CONSENT"   YES     NO   Continuing further  Investigational Product and study visits for follow-up?    Continuing consent from future biomedical research                                     "EVENTS"    YES     NO  AE   (IF YES SEE SOURCE)    SAE  (IF YES SEE SOURCE)    ENDPOINT   (IF YES SEE SOURCE)    REVASCULARIZATION  (IF YES SEE SOURCE)    AMPUTATION   (IF YES SEE SOURCE)    TROPONIN'S  (IF YES SEE SOURCE)  

## 2017-08-25 NOTE — Research (Signed)
Visit 1 and 2 PK drawn and collected

## 2017-08-26 ENCOUNTER — Telehealth: Payer: Self-pay | Admitting: *Deleted

## 2017-08-26 ENCOUNTER — Telehealth (HOSPITAL_COMMUNITY): Payer: Self-pay

## 2017-08-26 NOTE — Telephone Encounter (Signed)
Attempted to call patient in regards to cardiac rehab - lm on vm °

## 2017-08-26 NOTE — Telephone Encounter (Signed)
error 

## 2017-08-26 NOTE — Telephone Encounter (Signed)
Spoke with pt and scheduled him first available in with the HTN clinic on 5/23.  Pt agreeable to plan.

## 2017-08-26 NOTE — Telephone Encounter (Signed)
-----   Message from Lyn Records, MD sent at 08/26/2017  9:52 AM EDT ----- Regarding: Elevated BP Have this patient seen in BP clinic. Not scheduled to see me until 11/2017. ----- Message ----- From: Elaina Pattee, RN Sent: 08/23/2017   4:27 PM To: Lyn Records, MD  Good Afternoon,  Matthew Walker in the AEGIS infusion trial. His BP has been running a little on the high side. When he first come in his Bp is 161/112 and then after he sits a while was 142/98. Looking back through some of his visits his diastolic has been in the 90's. He said at home his pressures are in the 120 and 130s and good. I asked him to keep a log. I ran this by Dr Riley Kill and he wanted me to just let you know. Thanks! Cala Bradford :)

## 2017-08-27 ENCOUNTER — Other Ambulatory Visit: Payer: Self-pay | Admitting: Cardiology

## 2017-08-30 ENCOUNTER — Encounter: Payer: Federal, State, Local not specified - PPO | Admitting: *Deleted

## 2017-08-30 VITALS — BP 140/99 | HR 69 | Resp 14 | Wt 180.6 lb

## 2017-08-30 DIAGNOSIS — Z006 Encounter for examination for normal comparison and control in clinical research program: Secondary | ICD-10-CM

## 2017-08-30 MED ORDER — LISINOPRIL-HYDROCHLOROTHIAZIDE 20-25 MG PO TABS: 1.0000 | ORAL_TABLET | Freq: Every day | ORAL | 0 refills | Status: DC

## 2017-08-30 NOTE — Progress Notes (Signed)
Patient seen in the research clinic for follow up AEGIS study visit. Doing well overall.   General: Well developed, well nourished, male appearing in no acute distress. Head: Normocephalic, atraumatic.  Neck: Supple without bruits, JVD. Lungs:  Resp regular and unlabored, CTA. Heart: RRR, S1, S2, no S3, S4, or murmur; no rub. Abdomen: Soft, non-tender, non-distended with normoactive bowel sounds. No hepatomegaly. No rebound/guarding. No obvious abdominal masses. Extremities: No clubbing, cyanosis, edema. Distal pedal pulses are 2+ bilaterally.  Neuro: Alert and oriented X 3. Moves all extremities spontaneously. Psych: Normal affect.   Noted to have elevated blood pressure readings over the past couple of visits. He also reports his diastolic number has been consistently over 100 at home. In review of medications he is on combination lisinopril/HCTZ 20/25 (1/2 tab) and Toprol 50. HR in the 60s. Advised that he could increase his lisinopril/HCTZ to full tablet and follow blood pressures. Has an appt in the HTN clinic in 2 weeks. Review of labs showed stable renal function.   Signed, Laverda Page, NP-C 08/30/2017, 9:52 AM

## 2017-08-30 NOTE — Progress Notes (Signed)
Visit 6:  Pt doing well. Keeping log of his BP still having diastolic high. Lillia Abed NP saw patient in clinic this am for his PE and he is to increase his lisinopril/hctz to a whole tablet. He is going to also be seen in the HTN clinic on the 23rd per Dr Katrinka Blazing. No complaints of chest pain or shortness of breath. Meds are the same.                                      "CONSENT"   YES     NO   Continuing further Investigational Product and study visits for follow-up?    Continuing consent from future biomedical research                                       "EVENTS"    YES     NO  AE   (IF YES SEE SOURCE)    SAE  (IF YES SEE SOURCE)    ENDPOINT   (IF YES SEE SOURCE)    REVASCULARIZATION  (IF YES SEE SOURCE)    AMPUTATION   (IF YES SEE SOURCE)    TROPONIN'S  (IF YES SEE SOURCE)      Current Outpatient Medications:  .  aspirin 81 MG chewable tablet, Chew 1 tablet (81 mg total) by mouth daily., Disp: , Rfl:  .  atorvastatin (LIPITOR) 80 MG tablet, Take 1 tablet (80 mg total) by mouth daily at 6 PM., Disp: 90 tablet, Rfl: 0 .  clopidogrel (PLAVIX) 75 MG tablet, Take 1 tablet (75 mg total) by mouth daily with breakfast., Disp: 90 tablet, Rfl: 2 .  fexofenadine (ALLEGRA) 60 MG tablet, Take 60 mg by mouth daily as needed for allergies or rhinitis., Disp: , Rfl:  .  fluticasone (FLONASE) 50 MCG/ACT nasal spray, Place 1 spray into both nostrils as needed for congestion., Disp: , Rfl: 12 .  lisinopril-hydrochlorothiazide (PRINZIDE,ZESTORETIC) 20-25 MG tablet, Take 0.5 tablets by mouth daily., Disp: 30 tablet, Rfl: 0 .  metFORMIN (GLUCOPHAGE-XR) 500 MG 24 hr tablet, Take 1,000 mg by mouth 2 (two) times daily., Disp: , Rfl: 3 .  metoprolol tartrate (LOPRESSOR) 50 MG tablet, TAKE ONE TABLET BY MOUTH TWICE DAILY., Disp: 60 tablet, Rfl: 12 .  Multiple Vitamins-Minerals (EMERGEN-C IMMUNE PO), Take 1 packet by mouth daily., Disp: , Rfl:  .  nitroGLYCERIN (NITROSTAT)  0.4 MG SL tablet, Place 1 tablet (0.4 mg total) under the tongue every 5 (five) minutes as needed., Disp: 25 tablet, Rfl: 2 .  sodium chloride (OCEAN) 0.65 % SOLN nasal spray, Place 1 spray into both nostrils as needed for congestion., Disp: , Rfl:  .  TRAVEL SICKNESS 25 MG CHEW, Chew 25 mg by mouth as needed for dizziness., Disp: , Rfl: 1  Central labs drawn today:

## 2017-09-15 ENCOUNTER — Ambulatory Visit: Payer: Federal, State, Local not specified - PPO | Admitting: Pharmacist

## 2017-09-15 NOTE — Progress Notes (Deleted)
Patient ID: Matthew Walker                 DOB: 25-Jan-1966                      MRN: [id number]     HPI: Matthew Walker is a 52 y.o. male patient of Dr Katrinka Blazing referred by research group to HTN clinic. PMH is significant for HTN, uncontrolled DM with A1c of 10.6, HLD, tobacco abuse, and recent STEMI on 08/01/17. He is participating in the [employer] trial and had a follow up research visit with Laverda Page, NP on 08/30/17. His diastolic BP has been trending high and he was advised to increase his lisinopril-HCTZ to 20-25mg  daily. He presents today for follow up.  Needs bmet  Increase lisinopril to 40 if needed. Diastolic still high at home? Can start amlodipine  Current HTN meds: lisinopril-HCTZ 20-25mg  daily, metoprolol tartrate  BID Previously tried:  BP goal: <130/16mmHg  Family History: The patient's family history is not on file.   Social History: Smokes 1 PPD for the past 36 years. Denies alcohol and illicit drug use.  Diet:   Exercise:   Home BP readings:   Wt Readings from Last 3 Encounters:  08/30/17 180 lb 9.6 oz (81.9 kg)  08/16/17 182 lb (82.6 kg)  08/16/17 182 lb 9.6 oz (82.8 kg)   BP Readings from Last 3 Encounters:  08/30/17 (!) 140/99  08/23/17 (!) 162/107  08/23/17 (!) 142/98   Pulse Readings from Last 3 Encounters:  08/30/17 69  08/23/17 60  08/23/17 61    Renal function: CrCl cannot be calculated (Patient's most recent lab result is older than the maximum 21 days allowed.).  Past Medical History:  Diagnosis Date  . Arthritis    "knees" (08/01/2017)  . Coronary artery disease   . GERD (gastroesophageal reflux disease)   . High cholesterol   . Hypertension   . STEMI (ST elevation myocardial infarction) (HCC) 07/30/2017   PCI/DES x1 to mLAD with staged intervention to the pRCA, and Lcx. Normal EF  . Type II diabetes mellitus (HCC)     Current Outpatient Medications on File Prior to Visit  Medication Sig Dispense Refill  . aspirin 81 MG chewable  tablet Chew 1 tablet (81 mg total) by mouth daily.    Marland Kitchen atorvastatin (LIPITOR) 80 MG tablet Take 1 tablet (80 mg total) by mouth daily at 6 PM. 90 tablet 0  . clopidogrel (PLAVIX) 75 MG tablet Take 1 tablet (75 mg total) by mouth daily with breakfast. 90 tablet 2  . fexofenadine (ALLEGRA) 60 MG tablet Take 60 mg by mouth daily as needed for allergies or rhinitis.    . fluticasone (FLONASE) 50 MCG/ACT nasal spray Place 1 spray into both nostrils as needed for congestion.  12  . lisinopril-hydrochlorothiazide (PRINZIDE,ZESTORETIC) 20-25 MG tablet Take 1 tablet by mouth daily. 30 tablet 0  . metFORMIN (GLUCOPHAGE-XR) 500 MG 24 hr tablet Take 1,000 mg by mouth 2 (two) times daily.  3  . metoprolol tartrate (LOPRESSOR) 50 MG tablet TAKE ONE TABLET BY MOUTH TWICE DAILY. 60 tablet 12  . Multiple Vitamins-Minerals (EMERGEN-C IMMUNE PO) Take 1 packet by mouth daily.    . nitroGLYCERIN (NITROSTAT) 0.4 MG SL tablet Place 1 tablet (0.4 mg total) under the tongue every 5 (five) minutes as needed. 25 tablet 2  . sodium chloride (OCEAN) 0.65 % SOLN nasal spray Place 1 spray into both nostrils as needed for  congestion.    . TRAVEL SICKNESS 25 MG CHEW Chew 25 mg by mouth as needed for dizziness.  1   No current facility-administered medications on file prior to visit.     Allergies  Allergen Reactions  . Bee Venom Other (See Comments)    Hay Fever     Assessment/Plan:  1. Hypertension -

## 2017-09-20 ENCOUNTER — Telehealth: Payer: Self-pay | Admitting: *Deleted

## 2017-09-20 ENCOUNTER — Other Ambulatory Visit: Payer: Self-pay | Admitting: *Deleted

## 2017-09-20 NOTE — Telephone Encounter (Signed)
ERROR

## 2017-09-27 ENCOUNTER — Telehealth (HOSPITAL_COMMUNITY): Payer: Self-pay

## 2017-09-27 ENCOUNTER — Encounter (HOSPITAL_COMMUNITY): Payer: Self-pay

## 2017-09-27 NOTE — Telephone Encounter (Signed)
2nd attempt to call patient in regards to Cardiac Rehab - lm on vm. Sending letter. °

## 2017-09-30 ENCOUNTER — Encounter: Payer: Self-pay | Admitting: *Deleted

## 2017-09-30 DIAGNOSIS — Z006 Encounter for examination for normal comparison and control in clinical research program: Secondary | ICD-10-CM

## 2017-09-30 NOTE — Progress Notes (Signed)
Visit 8 phone call  Spoke with patient on phone. Doing well with no complaints of cp or sob. Meds are the same. BP is doing better with increase of Lisinopril/HCTZ.  Will call pt to schedule next appt in early July.                                    "CONSENT"   YES     NO   Continuing further Investigational Product and study visits for follow-up? [x]  []   Continuing consent from future biomedical research [x]  []                                   "EVENTS"    YES     NO  AE   (IF YES SEE SOURCE) []  [x]   SAE  (IF YES SEE SOURCE) []  [x]   ENDPOINT   (IF YES SEE SOURCE) []  [x]   REVASCULARIZATION  (IF YES SEE SOURCE) []  [x]   AMPUTATION   (IF YES SEE SOURCE) []  [x]   TROPONIN'S  (IF YES SEE SOURCE) []  [x]     Current Outpatient Medications:  .  aspirin 81 MG chewable tablet, Chew 1 tablet (81 mg total) by mouth daily., Disp: , Rfl:  .  atorvastatin (LIPITOR) 80 MG tablet, Take 1 tablet (80 mg total) by mouth daily at 6 PM., Disp: 90 tablet, Rfl: 0 .  clopidogrel (PLAVIX) 75 MG tablet, Take 1 tablet (75 mg total) by mouth daily with breakfast., Disp: 90 tablet, Rfl: 2 .  fexofenadine (ALLEGRA) 60 MG tablet, Take 60 mg by mouth daily as needed for allergies or rhinitis., Disp: , Rfl:  .  fluticasone (FLONASE) 50 MCG/ACT nasal spray, Place 1 spray into both nostrils as needed for congestion., Disp: , Rfl: 12 .  lisinopril-hydrochlorothiazide (PRINZIDE,ZESTORETIC) 20-25 MG tablet, Take 1 tablet by mouth daily., Disp: 30 tablet, Rfl: 0 .  metFORMIN (GLUCOPHAGE-XR) 500 MG 24 hr tablet, Take 1,000 mg by mouth 2 (two) times daily., Disp: , Rfl: 3 .  metoprolol tartrate (LOPRESSOR) 50 MG tablet, TAKE ONE TABLET BY MOUTH TWICE DAILY., Disp: 60 tablet, Rfl: 12 .  Multiple Vitamins-Minerals (EMERGEN-C IMMUNE PO), Take 1 packet by mouth daily., Disp: , Rfl:  .  nitroGLYCERIN (NITROSTAT) 0.4 MG SL tablet, Place 1 tablet (0.4 mg total) under the tongue every 5 (five) minutes as needed., Disp: 25 tablet, Rfl: 2 .   sodium chloride (OCEAN) 0.65 % SOLN nasal spray, Place 1 spray into both nostrils as needed for congestion., Disp: , Rfl:  .  TRAVEL SICKNESS 25 MG CHEW, Chew 25 mg by mouth as needed for dizziness., Disp: , Rfl: 1

## 2017-10-10 NOTE — Progress Notes (Signed)
Error on charting this was visit 7 not visit 8

## 2017-10-12 ENCOUNTER — Telehealth (HOSPITAL_COMMUNITY): Payer: Self-pay

## 2017-10-12 NOTE — Telephone Encounter (Signed)
3rd attempt to contact patient in regards to Cardiac Rehab - lm on vm °

## 2017-10-17 DIAGNOSIS — J019 Acute sinusitis, unspecified: Secondary | ICD-10-CM | POA: Diagnosis not present

## 2017-10-17 DIAGNOSIS — R05 Cough: Secondary | ICD-10-CM | POA: Diagnosis not present

## 2017-10-17 DIAGNOSIS — Z681 Body mass index (BMI) 19 or less, adult: Secondary | ICD-10-CM | POA: Diagnosis not present

## 2017-10-24 ENCOUNTER — Telehealth: Payer: Self-pay | Admitting: *Deleted

## 2017-10-25 NOTE — Telephone Encounter (Signed)
Spoke with patient on the phone.

## 2017-11-07 ENCOUNTER — Encounter: Payer: Federal, State, Local not specified - PPO | Admitting: *Deleted

## 2017-11-07 VITALS — BP 165/96 | HR 66 | Wt 182.0 lb

## 2017-11-07 DIAGNOSIS — Z006 Encounter for examination for normal comparison and control in clinical research program: Secondary | ICD-10-CM

## 2017-11-07 NOTE — Progress Notes (Addendum)
[facility] visit 8 Pt doing well, no complaints of cp or sob. Pt started new job and loving it. Meds are the same. Will see him back in October. No blood work needed.                                     "CONSENT"   YES     NO   Continuing further Investigational Product and study visits for follow-up? [x]  []   Continuing consent from future biomedical research [x]  []                                   "EVENTS"    YES     NO  AE   (IF YES SEE SOURCE) []  [x]   SAE  (IF YES SEE SOURCE) []  [x]   ENDPOINT   (IF YES SEE SOURCE) []  [x]   REVASCULARIZATION  (IF YES SEE SOURCE) []  [x]   AMPUTATION   (IF YES SEE SOURCE) []  [x]   TROPONIN'S  (IF YES SEE SOURCE) []  [x]    [id number]  MOBILITY:    I HAVE NO PROBLEMS WALKING [x]   I HAVE SLIGHT PROBLEMS WALKING []   I HAVE MODERATE PROBLEMS WALKING []   I HAVE SEVERE PROBLEMS WALKING []   I AM UNABLE TO WALK  []     SELF-CARE:   I HAVE NO PROBLEMS WASING OR DRESSING MYSELF  [x]   I HAVE SLIGHT PROBLEMS WASHING OR DRESSING MYSELF  []   I HAVE MODERATE PROBLEMS WASHING OR DRESSING MYSELF []   I HAVE SEVERE PROBLEMS WASHING OR DRESSING MYSELF  []   I HAVE SEVERE PROBLEMS WASHING OR DRESSING MYSELF  []   I AM UNABLE TO WASH OR DRESS MYSELF []     USUAL ACTIVITIES: (E.G. WORK/STUDY/HOUSEWORK/FAMILY OR LEISURE ACTIVITIES.    I HAVE NO PROBLEMS DOING MY USUAL ACTIVITIES [x]   I HAVE SLIGHT PROBLEMS DOING MY USUAL ACTIVITIES []   I HAVE MODERATE PROBLEMS DOING MY USUAL ACTIVIITIES []   I HAVE SEVERE PROBLEMS DOING MY USUAL ACTIVITIES []   I AM UNABLE TO DO MY USUAL ACTIVITIES []     PAIN /DISCOMFORT   I HAVE NO PAIN OR DISCOMFORT [x]   I HAVE SLIGHT PAIN OR DISCOMFORT []   I HAVE MODERATE PAIN OR DISCOMFORT []   I HAVE SEVERE PAIN OR DISCOMFORT []   I HAVE EXTREME PAIN OR DISCOMFORT []     ANXIETY/DEPRESSION   I AM NOT ANXIOUS OR DEPRESSED [x]   I AM SLIGHTLY ANXIOUS OR DEPRESSED []   I AM MODERATELY ANXIOUS OR DREPRESSED []   I AM SEVERELY ANXIOUS OR DEPRESSED []   I AM  EXTREMELY ANXIOUS OR DEPRESSED []     SCALE OF 0-100 HOW WOULD YOU RATE TODAY?  0 IS THE WORSE AND 100 IS THE BEST HEALTH YOU CAN IMAGINE: 88   Lifestyle Adherence Assessment:    YES NO  Abstinence from smoking/remaining tobacco free  X  Cardiac Diet X   Routine physical activity and/or cardiac rehabilitation X     Current Outpatient Medications:  .  aspirin 81 MG chewable tablet, Chew 1 tablet (81 mg total) by mouth daily., Disp: , Rfl:  .  atorvastatin (LIPITOR) 80 MG tablet, Take 1 tablet (80 mg total) by mouth daily at 6 PM., Disp: 90 tablet, Rfl: 0 .  clopidogrel (PLAVIX) 75 MG tablet, Take 1 tablet (75 mg total) by mouth daily with breakfast., Disp: 90 tablet, Rfl: 2 .  lisinopril-hydrochlorothiazide (PRINZIDE,ZESTORETIC) 20-25 MG tablet, Take 1 tablet by mouth daily., Disp: 30 tablet, Rfl: 0 .  metFORMIN (GLUCOPHAGE-XR) 500 MG 24 hr tablet, Take 500 mg by mouth 2 (two) times daily. , Disp: , Rfl: 3 .  metoprolol tartrate (LOPRESSOR) 50 MG tablet, TAKE ONE TABLET BY MOUTH TWICE DAILY., Disp: 60 tablet, Rfl: 12 .  fexofenadine (ALLEGRA) 60 MG tablet, Take 60 mg by mouth daily as needed for allergies or rhinitis., Disp: , Rfl:  .  fluticasone (FLONASE) 50 MCG/ACT nasal spray, Place 1 spray into both nostrils as needed for congestion., Disp: , Rfl: 12 .  Multiple Vitamins-Minerals (EMERGEN-C IMMUNE PO), Take 1 packet by mouth daily., Disp: , Rfl:  .  nitroGLYCERIN (NITROSTAT) 0.4 MG SL tablet, Place 1 tablet (0.4 mg total) under the tongue every 5 (five) minutes as needed. (Patient not taking: Reported on 11/07/2017), Disp: 25 tablet, Rfl: 2 .  sodium chloride (OCEAN) 0.65 % SOLN nasal spray, Place 1 spray into both nostrils as needed for congestion., Disp: , Rfl:  .  TRAVEL SICKNESS 25 MG CHEW, Chew 25 mg by mouth as needed for dizziness., Disp: , Rfl: 1

## 2017-11-07 NOTE — Telephone Encounter (Signed)
Pt scheduled appt

## 2017-11-08 ENCOUNTER — Other Ambulatory Visit: Payer: Self-pay | Admitting: Cardiology

## 2017-11-18 ENCOUNTER — Encounter (HOSPITAL_COMMUNITY): Payer: Self-pay

## 2017-11-18 ENCOUNTER — Emergency Department (HOSPITAL_COMMUNITY): Payer: Federal, State, Local not specified - PPO

## 2017-11-18 ENCOUNTER — Other Ambulatory Visit: Payer: Self-pay

## 2017-11-18 ENCOUNTER — Inpatient Hospital Stay (HOSPITAL_COMMUNITY)
Admission: EM | Admit: 2017-11-18 | Discharge: 2017-11-19 | DRG: 066 | Disposition: A | Discharge status: Home / Self Care | Payer: Federal, State, Local not specified - PPO | Attending: Internal Medicine | Admitting: Internal Medicine

## 2017-11-18 DIAGNOSIS — E785 Hyperlipidemia, unspecified: Secondary | ICD-10-CM | POA: Diagnosis not present

## 2017-11-18 DIAGNOSIS — H532 Diplopia: Secondary | ICD-10-CM | POA: Diagnosis not present

## 2017-11-18 DIAGNOSIS — I10 Essential (primary) hypertension: Secondary | ICD-10-CM | POA: Diagnosis present

## 2017-11-18 DIAGNOSIS — I6389 Other cerebral infarction: Secondary | ICD-10-CM

## 2017-11-18 DIAGNOSIS — I6350 Cerebral infarction due to unspecified occlusion or stenosis of unspecified cerebral artery: Secondary | ICD-10-CM | POA: Diagnosis not present

## 2017-11-18 DIAGNOSIS — Z87891 Personal history of nicotine dependence: Secondary | ICD-10-CM

## 2017-11-18 DIAGNOSIS — E119 Type 2 diabetes mellitus without complications: Secondary | ICD-10-CM | POA: Diagnosis present

## 2017-11-18 DIAGNOSIS — R29703 NIHSS score 3: Secondary | ICD-10-CM | POA: Diagnosis not present

## 2017-11-18 DIAGNOSIS — E7800 Pure hypercholesterolemia, unspecified: Secondary | ICD-10-CM | POA: Diagnosis not present

## 2017-11-18 DIAGNOSIS — Z720 Tobacco use: Secondary | ICD-10-CM | POA: Diagnosis present

## 2017-11-18 DIAGNOSIS — Z7982 Long term (current) use of aspirin: Secondary | ICD-10-CM

## 2017-11-18 DIAGNOSIS — I252 Old myocardial infarction: Secondary | ICD-10-CM | POA: Diagnosis not present

## 2017-11-18 DIAGNOSIS — K219 Gastro-esophageal reflux disease without esophagitis: Secondary | ICD-10-CM | POA: Diagnosis not present

## 2017-11-18 DIAGNOSIS — E1151 Type 2 diabetes mellitus with diabetic peripheral angiopathy without gangrene: Secondary | ICD-10-CM | POA: Diagnosis present

## 2017-11-18 DIAGNOSIS — I5030 Unspecified diastolic (congestive) heart failure: Secondary | ICD-10-CM | POA: Diagnosis not present

## 2017-11-18 DIAGNOSIS — I25119 Atherosclerotic heart disease of native coronary artery with unspecified angina pectoris: Secondary | ICD-10-CM | POA: Diagnosis present

## 2017-11-18 DIAGNOSIS — I6322 Cerebral infarction due to unspecified occlusion or stenosis of basilar arteries: Secondary | ICD-10-CM | POA: Diagnosis not present

## 2017-11-18 DIAGNOSIS — I639 Cerebral infarction, unspecified: Principal | ICD-10-CM

## 2017-11-18 DIAGNOSIS — I672 Cerebral atherosclerosis: Secondary | ICD-10-CM | POA: Diagnosis present

## 2017-11-18 DIAGNOSIS — Z7902 Long term (current) use of antithrombotics/antiplatelets: Secondary | ICD-10-CM

## 2017-11-18 DIAGNOSIS — H4922 Sixth [abducent] nerve palsy, left eye: Secondary | ICD-10-CM | POA: Diagnosis present

## 2017-11-18 DIAGNOSIS — I2102 ST elevation (STEMI) myocardial infarction involving left anterior descending coronary artery: Secondary | ICD-10-CM | POA: Diagnosis not present

## 2017-11-18 DIAGNOSIS — I2510 Atherosclerotic heart disease of native coronary artery without angina pectoris: Secondary | ICD-10-CM | POA: Diagnosis present

## 2017-11-18 DIAGNOSIS — R202 Paresthesia of skin: Secondary | ICD-10-CM | POA: Diagnosis not present

## 2017-11-18 DIAGNOSIS — Z79899 Other long term (current) drug therapy: Secondary | ICD-10-CM

## 2017-11-18 DIAGNOSIS — Z955 Presence of coronary angioplasty implant and graft: Secondary | ICD-10-CM | POA: Diagnosis not present

## 2017-11-18 DIAGNOSIS — Z7984 Long term (current) use of oral hypoglycemic drugs: Secondary | ICD-10-CM | POA: Diagnosis not present

## 2017-11-18 DIAGNOSIS — Z9861 Coronary angioplasty status: Secondary | ICD-10-CM

## 2017-11-18 HISTORY — DX: Cerebral infarction, unspecified: I63.9

## 2017-11-18 LAB — PROTIME-INR
INR: 0.96
Prothrombin Time: 12.6 seconds (ref 11.4–15.2)

## 2017-11-18 LAB — HEMOGLOBIN A1C
Hgb A1c MFr Bld: 10.3 % — ABNORMAL HIGH (ref 4.8–5.6)
Mean Plasma Glucose: 248.91 mg/dL

## 2017-11-18 LAB — DIFFERENTIAL
Basophils Absolute: 0.1 10*3/uL (ref 0.0–0.1)
Basophils Relative: 1 %
Eosinophils Absolute: 0.1 10*3/uL (ref 0.0–0.7)
Eosinophils Relative: 1 %
Lymphocytes Relative: 25 %
Lymphs Abs: 2.1 10*3/uL (ref 0.7–4.0)
Monocytes Absolute: 0.6 10*3/uL (ref 0.1–1.0)
Monocytes Relative: 8 %
Neutro Abs: 5.7 10*3/uL (ref 1.7–7.7)
Neutrophils Relative %: 65 %

## 2017-11-18 LAB — CBC
HCT: 52 % (ref 39.0–52.0)
HCT: 49.2 % (ref 39.0–52.0)
Hemoglobin: 18.3 g/dL — ABNORMAL HIGH (ref 13.0–17.0)
Hemoglobin: 16.6 g/dL (ref 13.0–17.0)
MCH: 30.5 pg (ref 26.0–34.0)
MCH: 29.3 pg (ref 26.0–34.0)
MCHC: 35.2 g/dL (ref 30.0–36.0)
MCHC: 33.7 g/dL (ref 30.0–36.0)
MCV: 86.7 fL (ref 78.0–100.0)
MCV: 86.8 fL (ref 78.0–100.0)
Platelets: 119 10*3/uL — ABNORMAL LOW (ref 150–400)
Platelets: 132 10*3/uL — ABNORMAL LOW (ref 150–400)
RBC: 6 MIL/uL — ABNORMAL HIGH (ref 4.22–5.81)
RBC: 5.67 MIL/uL (ref 4.22–5.81)
RDW: 13.2 % (ref 11.5–15.5)
RDW: 13.2 % (ref 11.5–15.5)
WBC: 8.5 10*3/uL (ref 4.0–10.5)
WBC: 8.9 10*3/uL (ref 4.0–10.5)

## 2017-11-18 LAB — GLUCOSE, CAPILLARY
Glucose-Capillary: 124 mg/dL — ABNORMAL HIGH (ref 70–99)
Glucose-Capillary: 213 mg/dL — ABNORMAL HIGH (ref 70–99)
Glucose-Capillary: 192 mg/dL — ABNORMAL HIGH (ref 70–99)

## 2017-11-18 LAB — COMPREHENSIVE METABOLIC PANEL
ALT: 24 U/L (ref 0–44)
AST: 16 U/L (ref 15–41)
Albumin: 4.3 g/dL (ref 3.5–5.0)
Alkaline Phosphatase: 75 U/L (ref 38–126)
Anion gap: 11 (ref 5–15)
BUN: 15 mg/dL (ref 6–20)
CO2: 26 mmol/L (ref 22–32)
Calcium: 9.8 mg/dL (ref 8.9–10.3)
Chloride: 99 mmol/L (ref 98–111)
Creatinine, Ser: 0.81 mg/dL (ref 0.61–1.24)
GFR calc Af Amer: >60 mL/min (ref >60)
GFR calc non Af Amer: >60 mL/min (ref >60)
Glucose, Bld: 283 mg/dL — ABNORMAL HIGH (ref 70–99)
Potassium: 3.6 mmol/L (ref 3.5–5.1)
Sodium: 136 mmol/L (ref 135–145)
Total Bilirubin: 1.4 mg/dL — ABNORMAL HIGH (ref 0.3–1.2)
Total Protein: 7.7 g/dL (ref 6.5–8.1)

## 2017-11-18 LAB — CREATININE, SERUM
Creatinine, Ser: 0.85 mg/dL (ref 0.61–1.24)
GFR calc Af Amer: >60 mL/min
GFR calc non Af Amer: >60 mL/min

## 2017-11-18 LAB — URINALYSIS, ROUTINE W REFLEX MICROSCOPIC
Bacteria, UA: NONE SEEN
Bilirubin Urine: NEGATIVE
Glucose, UA: >=500 mg/dL — AB
Hgb urine dipstick: NEGATIVE
Ketones, ur: NEGATIVE mg/dL
Leukocytes, UA: NEGATIVE
Nitrite: NEGATIVE
Protein, ur: NEGATIVE mg/dL
Specific Gravity, Urine: 1.006 (ref 1.005–1.030)
pH: 6 (ref 5.0–8.0)

## 2017-11-18 LAB — RAPID URINE DRUG SCREEN, HOSP PERFORMED
Amphetamines: NOT DETECTED
Barbiturates: NOT DETECTED
Benzodiazepines: NOT DETECTED
Cocaine: NOT DETECTED
Opiates: NOT DETECTED
Tetrahydrocannabinol: NOT DETECTED

## 2017-11-18 LAB — ETHANOL: Alcohol, Ethyl (B): <10 mg/dL (ref <10)

## 2017-11-18 LAB — APTT: aPTT: 28 seconds (ref 24–36)

## 2017-11-18 LAB — TROPONIN I: Troponin I: <0.03 ng/mL (ref <0.03)

## 2017-11-18 MED ORDER — ACETAMINOPHEN 325 MG PO TABS
650.0000 mg | ORAL_TABLET | ORAL | Status: DC | PRN
  Administered 2017-11-18: 650 mg via ORAL
  Filled 2017-11-18: qty 2

## 2017-11-18 MED ORDER — SODIUM CHLORIDE 0.9 % IV SOLN
INTRAVENOUS | Status: DC
  Administered 2017-11-18 – 2017-11-19 (×2): via INTRAVENOUS

## 2017-11-18 MED ORDER — INSULIN ASPART 100 UNIT/ML ~~LOC~~ SOLN
4.0000 [IU] | Freq: Three times a day (TID) | SUBCUTANEOUS | Status: DC
  Administered 2017-11-18 – 2017-11-19 (×3): 4 [IU] via SUBCUTANEOUS

## 2017-11-18 MED ORDER — ACETAMINOPHEN 160 MG/5ML PO SOLN: 650.0000 mg | ORAL | Status: DC | PRN

## 2017-11-18 MED ORDER — LISINOPRIL-HYDROCHLOROTHIAZIDE 20-25 MG PO TABS: 1.0000 | ORAL_TABLET | Freq: Every day | ORAL | Status: DC

## 2017-11-18 MED ORDER — STROKE: EARLY STAGES OF RECOVERY BOOK
Freq: Once | Status: AC
  Administered 2017-11-18: 17:00:00

## 2017-11-18 MED ORDER — SENNOSIDES-DOCUSATE SODIUM 8.6-50 MG PO TABS: 1.0000 | ORAL_TABLET | Freq: Every evening | ORAL | Status: DC | PRN

## 2017-11-18 MED ORDER — GADOBENATE DIMEGLUMINE 529 MG/ML IV SOLN
17.0000 mL | Freq: Once | INTRAVENOUS | Status: AC | PRN
  Administered 2017-11-18: 17 mL via INTRAVENOUS

## 2017-11-18 MED ORDER — HYDROCODONE-ACETAMINOPHEN 5-325 MG PO TABS
1.0000 | ORAL_TABLET | Freq: Four times a day (QID) | ORAL | Status: DC | PRN
  Administered 2017-11-18: 1 via ORAL
  Filled 2017-11-18: qty 1

## 2017-11-18 MED ORDER — INSULIN ASPART 100 UNIT/ML ~~LOC~~ SOLN
0.0000 [IU] | Freq: Three times a day (TID) | SUBCUTANEOUS | Status: DC
  Administered 2017-11-18: 2 [IU] via SUBCUTANEOUS
  Administered 2017-11-19 (×2): 3 [IU] via SUBCUTANEOUS

## 2017-11-18 MED ORDER — HYDROCHLOROTHIAZIDE 25 MG PO TABS
25.0000 mg | ORAL_TABLET | Freq: Every day | ORAL | Status: DC
  Administered 2017-11-18 – 2017-11-19 (×2): 25 mg via ORAL
  Filled 2017-11-18 (×2): qty 1

## 2017-11-18 MED ORDER — METOPROLOL TARTRATE 50 MG PO TABS
50.0000 mg | ORAL_TABLET | Freq: Two times a day (BID) | ORAL | Status: DC
  Administered 2017-11-18 – 2017-11-19 (×2): 50 mg via ORAL
  Filled 2017-11-18 (×3): qty 1

## 2017-11-18 MED ORDER — INSULIN ASPART 100 UNIT/ML ~~LOC~~ SOLN: 0.0000 [IU] | Freq: Every day | SUBCUTANEOUS | Status: DC

## 2017-11-18 MED ORDER — ENOXAPARIN SODIUM 40 MG/0.4ML ~~LOC~~ SOLN
40.0000 mg | SUBCUTANEOUS | Status: DC
  Administered 2017-11-18: 40 mg via SUBCUTANEOUS
  Filled 2017-11-18: qty 0.4

## 2017-11-18 MED ORDER — CLOPIDOGREL BISULFATE 75 MG PO TABS
75.0000 mg | ORAL_TABLET | Freq: Every day | ORAL | Status: DC
  Administered 2017-11-19: 75 mg via ORAL
  Filled 2017-11-18 (×2): qty 1

## 2017-11-18 MED ORDER — LISINOPRIL 20 MG PO TABS
20.0000 mg | ORAL_TABLET | Freq: Every day | ORAL | Status: DC
Start: 2017-11-18 — End: 2017-11-19
  Administered 2017-11-18 – 2017-11-19 (×2): 20 mg via ORAL
  Filled 2017-11-18 (×2): qty 1

## 2017-11-18 MED ORDER — ASPIRIN 325 MG PO TABS
325.0000 mg | ORAL_TABLET | Freq: Every day | ORAL | Status: DC
  Administered 2017-11-19: 325 mg via ORAL
  Filled 2017-11-18: qty 1

## 2017-11-18 MED ORDER — ATORVASTATIN CALCIUM 80 MG PO TABS
80.0000 mg | ORAL_TABLET | Freq: Every day | ORAL | Status: DC
  Administered 2017-11-18: 80 mg via ORAL
  Filled 2017-11-18: qty 1

## 2017-11-18 MED ORDER — ASPIRIN 81 MG PO CHEW
81.0000 mg | CHEWABLE_TABLET | Freq: Every day | ORAL | Status: DC
  Filled 2017-11-18: qty 1

## 2017-11-18 MED ORDER — ACETAMINOPHEN 650 MG RE SUPP: 650.0000 mg | RECTAL | Status: DC | PRN

## 2017-11-18 NOTE — H&P (Signed)
History and Physical    Matthew Walker YNW:[id number] DOB: 02/28/1966 DOA: 11/18/2017  Referring MD/NP/PA: Isabell JarvisKathleen McManus,EDP PCP: Estanislado PandySasser, Paul W, MD  Patient coming from: Home  Chief Complaint: Blurry/double vision  HPI: Matthew Walker is a 52 y.o. male with history significant for hypertension, hyperlipidemia, type 2 diabetes, history of ST elevated MI earlier this year with PCI/DES x1 to LAD.  He was apparently enrolled in a statin clinical trial and is still undergoing follow-up for this.  Patient states that yesterday evening at around 630 to 7 PM as he pulled into his driveway at home he noticed significant blurry/double vision when looking to the left.  He thought there was a problem with his contacts and went to bed.  When he woke up this morning and open his eyes he had severe diplopia and dizziness.  He also complained of imbalance while walking but states that this only happened when his eyes were open, if he tried to walk with his eyes closed he did not have an issue.  He decided to come into the hospital for evaluation.  EDP proceeded immediately with an MRI of the brain that showed an acute/subacute left inferior pontine infarct and admission was requested.  Patient and patient's family are very insistent on patient being [facility].  Past Medical/Surgical History: Past Medical History:  Diagnosis Date  . Arthritis    "knees" (08/01/2017)  . Coronary artery disease   . GERD (gastroesophageal reflux disease)   . High cholesterol   . Hypertension   . STEMI (ST elevation myocardial infarction) (HCC) 07/30/2017   PCI/DES x1 to mLAD with staged intervention to the pRCA, and Lcx. Normal EF  . Type II diabetes mellitus (HCC)     Past Surgical History:  Procedure Laterality Date  . CORONARY STENT INTERVENTION N/A 07/30/2017   Procedure: CORONARY STENT INTERVENTION;  Surgeon: Lyn RecordsSmith, Henry W, MD;  [facility];  Service: Cardiovascular;  Laterality: N/A;  .  CORONARY STENT INTERVENTION N/A 08/01/2017   Procedure: CORONARY STENT INTERVENTION;  Surgeon: Marykay LexHarding, David W, MD;  [facility];  Service: Cardiovascular;  Laterality: N/A;  . CORONARY/GRAFT ACUTE MI REVASCULARIZATION N/A 07/30/2017   Procedure: Coronary/Graft Acute MI Revascularization;  Surgeon: Lyn RecordsSmith, Henry W, MD;  Location: [facility] INVASIVE CV LAB;  Service: Cardiovascular;  Laterality: N/A;  . INGUINAL HERNIA REPAIR Bilateral   . KNEE ARTHROSCOPY Right   . LEFT HEART CATH AND CORONARY ANGIOGRAPHY N/A 07/30/2017   Procedure: LEFT HEART CATH AND CORONARY ANGIOGRAPHY;  Surgeon: Lyn RecordsSmith, Henry W, MD;  Location: [facility] INVASIVE CV LAB;  Service: Cardiovascular;  Laterality: N/A;  . LEFT HEART CATH AND CORONARY ANGIOGRAPHY N/A 08/01/2017   Procedure: LEFT HEART CATH AND CORONARY ANGIOGRAPHY;  Surgeon: Marykay LexHarding, David W, MD;  [facility];  Service: Cardiovascular;  Laterality: N/A;    Social History:  reports that he quit smoking about 3 months ago. His smoking use included cigarettes. He has a 36.00 pack-year smoking history. He has never used smokeless tobacco. He reports that he drank alcohol. He reports that he has current or past drug history.  Allergies: Allergies  Allergen Reactions  . Bee Venom Other (See Comments)    Hay Fever    Family History:  Patient is unaware of family history of significance including stroke or heart attack.  Prior to Admission medications   Medication Sig Start Date End Date Taking? Authorizing Provider  aspirin 81 MG chewable tablet Chew  1 tablet (81 mg total) by mouth daily. 08/02/17  Yes Laverda Page B, NP  atorvastatin (LIPITOR) 80 MG tablet TAKE ONE TABLET BY MOUTH DAILY AT 6 PM. 11/08/17  Yes Bhagat, Bhavinkumar, PA  clopidogrel (PLAVIX) 75 MG tablet Take 1 tablet (75 mg total) by mouth daily with breakfast. 08/02/17  Yes Arty Baumgartner, NP  fexofenadine (ALLEGRA) 60 MG tablet Take 60 mg by mouth daily as needed for allergies or  rhinitis.   Yes [provider]  fluticasone (FLONASE) 50 MCG/ACT nasal spray Place 1 spray into both nostrils as needed for congestion. 07/25/17  Yes [provider]  lisinopril-hydrochlorothiazide (PRINZIDE,ZESTORETIC) 20-25 MG tablet Take 1 tablet by mouth daily. 08/30/17  Yes Arty Baumgartner, NP  metFORMIN (GLUCOPHAGE-XR) 500 MG 24 hr tablet Take 500 mg by mouth 2 (two) times daily.  07/18/17  Yes [provider]  metoprolol tartrate (LOPRESSOR) 50 MG tablet TAKE ONE TABLET BY MOUTH TWICE DAILY. 08/29/17  Yes Lyn Records, MD  Multiple Vitamins-Minerals (EMERGEN-C IMMUNE PO) Take 1 packet by mouth daily.   Yes [provider]  nitroGLYCERIN (NITROSTAT) 0.4 MG SL tablet Place 1 tablet (0.4 mg total) under the tongue every 5 (five) minutes as needed. Patient not taking: Reported on 11/07/2017 08/02/17   Laverda Page B, NP  sodium chloride (OCEAN) 0.65 % SOLN nasal spray Place 1 spray into both nostrils as needed for congestion.    [provider]  TRAVEL SICKNESS 25 MG CHEW Chew 25 mg by mouth as needed for dizziness. 05/03/17   [provider]    Review of Systems:  Constitutional: Denies fever, chills, diaphoresis, appetite change and fatigue.  HEENT: Denies photophobia, eye pain, redness, hearing loss, ear pain, congestion, sore throat, rhinorrhea, sneezing, mouth sores, trouble swallowing, neck pain, neck stiffness and tinnitus.   Respiratory: Denies SOB, DOE, cough, chest tightness,  and wheezing.   Cardiovascular: Denies chest pain, palpitations and leg swelling.  Gastrointestinal: Denies nausea, vomiting, abdominal pain, diarrhea, constipation, blood in stool and abdominal distention.  Genitourinary: Denies dysuria, urgency, frequency, hematuria, flank pain and difficulty urinating.  Endocrine: Denies: hot or cold intolerance, sweats, changes in hair or nails, polyuria, polydipsia. Musculoskeletal: Denies myalgias, back pain, joint  swelling, arthralgias and gait problem.  Skin: Denies pallor, rash and wound.  Neurological: Denies dizziness, seizures, syncope, weakness, light-headedness, numbness and headaches.  Hematological: Denies adenopathy. Easy bruising, personal or family bleeding history  Psychiatric/Behavioral: Denies suicidal ideation, mood changes, confusion, nervousness, sleep disturbance and agitation    Physical Exam: Vitals:   11/18/17 1230 11/18/17 1300 11/18/17 1330 11/18/17 1400  BP: (!) 168/113 (!) 167/106 (!) 166/107 (!) 171/112  Pulse:      Resp:      Temp:      TempSrc:      SpO2:      Weight:      Height:         Constitutional: NAD, calm, comfortable Eyes: PERRL, lids and conjunctivae normal, no nystagmus ENMT: Mucous membranes are moist. Posterior pharynx clear of any exudate or lesions.Normal dentition.  Neck: normal, supple, no masses, no thyromegaly Respiratory: clear to auscultation bilaterally, no wheezing, no crackles. Normal respiratory effort. No accessory muscle use.  Cardiovascular: Regular rate and rhythm, no murmurs / rubs / gallops. No extremity edema. 2+ pedal pulses. No carotid bruits.  Abdomen: no tenderness, no masses palpated. No hepatosplenomegaly. Bowel sounds positive.  Musculoskeletal: no clubbing / cyanosis. No joint deformity upper and lower extremities. Good  ROM, no contractures. Normal muscle tone.  Skin: no rashes, lesions, ulcers. No induration Neurologic: Cranial nerves II through XII are grossly intact with the exception of a significant left abducens nerve palsy (patient's left eye is unable to look out on visual field testing) sensation intact, DTR normal. Strength 5/5 in all 4.  Psychiatric: Normal judgment and insight. Alert and oriented x 3. Normal mood.    Labs on Admission: I have personally reviewed the following labs and imaging studies  CBC: Recent Labs  Lab 11/18/17 0844  WBC 8.5  NEUTROABS 5.7  HGB 18.3*  HCT 52.0  MCV 86.7  PLT 119*    Basic Metabolic Panel: Recent Labs  Lab 11/18/17 0844  NA 136  K 3.6  CL 99  CO2 26  GLUCOSE 283*  BUN 15  CREATININE 0.81  CALCIUM 9.8   GFR: Estimated Creatinine Clearance: 110.2 mL/min (by C-G formula based on SCr of 0.81 mg/dL). Liver Function Tests: Recent Labs  Lab 11/18/17 0844  AST 16  ALT 24  ALKPHOS 75  BILITOT 1.4*  PROT 7.7  ALBUMIN 4.3   No results for input(s): LIPASE, AMYLASE in the last 168 hours. No results for input(s): AMMONIA in the last 168 hours. Coagulation Profile: Recent Labs  Lab 11/18/17 0844  INR 0.96   Cardiac Enzymes: Recent Labs  Lab 11/18/17 0844  TROPONINI <0.03   BNP (last 3 results) No results for input(s): PROBNP in the last 8760 hours. HbA1C: No results for input(s): HGBA1C in the last 72 hours. CBG: No results for input(s): GLUCAP in the last 168 hours. Lipid Profile: No results for input(s): CHOL, HDL, LDLCALC, TRIG, CHOLHDL, LDLDIRECT in the last 72 hours. Thyroid Function Tests: No results for input(s): TSH, T4TOTAL, FREET4, T3FREE, THYROIDAB in the last 72 hours. Anemia Panel: No results for input(s): VITAMINB12, FOLATE, FERRITIN, TIBC, IRON, RETICCTPCT in the last 72 hours. Urine analysis:    Component Value Date/Time   COLORURINE STRAW (A) 11/18/2017 0807   APPEARANCEUR CLEAR 11/18/2017 0807   LABSPEC 1.006 11/18/2017 0807   PHURINE 6.0 11/18/2017 0807   GLUCOSEU >=500 (A) 11/18/2017 0807   HGBUR NEGATIVE 11/18/2017 0807   BILIRUBINUR NEGATIVE 11/18/2017 0807   KETONESUR NEGATIVE 11/18/2017 0807   PROTEINUR NEGATIVE 11/18/2017 0807   NITRITE NEGATIVE 11/18/2017 0807   LEUKOCYTESUR NEGATIVE 11/18/2017 0807   Sepsis Labs: @LABRCNTIP (procalcitonin:4,lacticidven:4) )No results found for this or any previous visit (from the past 240 hour(s)).   Radiological Exams on Admission: Mr Maxine Glenn Neck W Wo Contrast  Result Date: 11/18/2017 CLINICAL DATA:  Stroke EXAM: MRA NECK WITHOUT AND WITH CONTRAST TECHNIQUE:  Multiplanar and multiecho pulse sequences of the neck were obtained without and with intravenous contrast. Angiographic images of the neck were obtained using MRA technique without and with intravenous contrast. CONTRAST:  17mL MULTIHANCE GADOBENATE DIMEGLUMINE 529 MG/ML IV SOLN COMPARISON:  MRI/MRA head 11/18/2017 FINDINGS: Antegrade flow in both carotid and vertebral arteries. Carotid bifurcation is normal bilaterally without stenosis or irregularity. Internal carotid artery normal bilaterally. Both vertebral arteries are patent to the basilar. There is a moderate stenosis of the distal left vertebral artery as noted on prior MRA head IMPRESSION: Negative for carotid stenosis Moderate stenosis distal left vertebral artery. Electronically Signed   By: Marlan Palau M.D.   On: 11/18/2017 12:00   Mr Brain Wo Contrast (neuro Protocol)  Result Date: 11/18/2017 CLINICAL DATA:  Left eye sixth nerve palsy. Right-sided weakness. Diplopia. Focal neuro deficit, greater than 6 hours, stroke suspected. EXAM:  MRI HEAD WITHOUT CONTRAST TECHNIQUE: Multiplanar, multiecho pulse sequences of the brain and surrounding structures were obtained without intravenous contrast. COMPARISON:  CT head without contrast 09/09/2012. MRI brain 03/24/2007. FINDINGS: Brain: Acute left paramedian inferior pontine infarct likely impacts the left sixth nerve nucleus or white matter tract. There is no associated hemorrhage or mass lesion. No supratentorial infarcts are present. T2 signal changes are associated with the area of acute infarction. Mild periventricular and subcortical white matter changes are noted bilaterally. The ventricles are of normal size. No significant extra-axial fluid collection is present. Remote lacunar infarcts are present in the cerebellum bilaterally. Vascular: Flow is present in the major intracranial arteries. Skull and upper cervical spine: The skull base is within normal limits. The craniocervical junction is  normal. The visualized intracranial contents crash that Sinuses/Orbits: The paranasal sinuses and mastoid air cells are clear. Globes and orbits are within normal limits. IMPRESSION: 1. Acute/subacute left paramedian inferior pontine infarct likely impacts the left sixth nerve nucleus or nerve. This corresponds with the patient's M2 since palsy and diplopia. 2. No other acute infarct. 3. Remote lacunar infarcts of the cerebellum bilaterally. 4. Periventricular and subcortical white matter changes are mildly advanced for age. This likely reflects the sequela of chronic microvascular ischemia. These results were called by telephone at the time of interpretation on 11/18/2017 at 9:48 am to Dr. Samuel Jester , who verbally acknowledged these results. Electronically Signed   By: Marin Roberts M.D.   On: 11/18/2017 09:51   Mr Maxine Glenn Head (cerebral Arteries)  Result Date: 11/18/2017 CLINICAL DATA:  Diplopia.  Focal neuro deficit. EXAM: MRA HEAD WITHOUT CONTRAST TECHNIQUE: Angiographic images of the Circle of Willis were obtained using MRA technique without intravenous contrast. COMPARISON:  CT head without contrast/17/14 FINDINGS: The right internal carotid artery is tortuous in the cervical ICA without a significant stenosis. Atherosclerotic changes are present within the cavernous internal carotid arteries bilaterally without a significant stenosis relative to the more distal ICA terminus. The A1 and M1 segments are normal. The anterior communicating artery is patent. MCA bifurcations are intact. There is some attenuation of ACA branch vessels bilaterally. The vertebral arteries are within normal limits bilaterally. PICA origins are visualized and normal. There is a moderate high-grade distal left vertebral artery stenosis. A mild to moderate right vertebral artery stenosis is present. Both of these are at the vertebrobasilar junction. The basilar artery is normal. Moderate P2 segment stenoses are present  bilaterally. IMPRESSION: 1. Moderate to severe distal left vertebral artery stenosis at the vertebrobasilar junction. 2. Mild to moderate distal right vertebral artery stenosis at the vertebrobasilar junction. 3. Moderate P2 segment stenoses within the PCA vessels bilaterally. 4. Atherosclerotic changes within the internal carotid arteries without significant stenosis proximally in the anterior circulation. 5. Distal small vessel disease within the ACA and MCA branch vessels. Electronically Signed   By: Marin Roberts M.D.   On: 11/18/2017 09:45    EKG: Independently reviewed.  Normal sinus rhythm with an old inferior and anterior infarct  Assessment/Plan Active Problems:   STEMI (ST elevation myocardial infarction) (HCC)   Type 2 diabetes mellitus with complication, without long-term current use of insulin (HCC)   Hypertension, essential   Hyperlipidemia LDL goal <70   Coronary artery disease involving native coronary artery of native heart with angina pectoris (HCC)   Tobacco use   Left pontine CVA (HCC)    Acute/subacute left pontine CVA -This is the cause of his diplopia and left VI nerve palsy. -Has  already been seen by tele-neurology who recommends admission.  States there is nothing to do about the moderate to severe left vertebral artery stenosis noted on MRA. -Patient has already passed swallow evaluation, will allow diet. -PT/OT. -Is on aspirin and Plavix which we will continue as per neurology recommendations. -2D echo requested, lipids. -Patient and patient's family are insistent upon transfer to [facility].  Neurology will need to be consulted.  Hypertension -Blood pressure remains elevated, will allow permissive hypertension for 72 hours following acute CVA. -Home blood pressure medications have been restarted nonetheless given his systolics in the 180 range.  Hyperlipidemia -Continue statin.  Type 2 diabetes -Check A1c, place on a moderate sliding scale.   DVT  prophylaxis: Lovenox Code Status: Full code Family Communication: Mother and sister at bedside updated on plan of care and all questions answered Disposition Plan: Transfer to [facility] Consults called: Neurology Admission status: Admit - It is my clinical opinion that admission to INPATIENT is reasonable and necessary because of the expectation that this patient will require hospital care that crosses at least 2 midnights to treat this condition based on the medical complexity of the problems presented.  Given the aforementioned information, the predictability of an adverse outcome is felt to be significant.     Time Spent: 85 minutes  Kataleena Holsapple Philip Aspen MD [facility] Pager [phone]  If 7PM-7AM, please contact night-coverage [email or web address] Password [facility]  11/18/2017, 3:13 PM

## 2017-11-18 NOTE — ED Provider Notes (Signed)
[facility]ERGENCY DEPARTMENT Provider Note   CSN: [id number] Arrival date & time: 11/18/17  [id number]     History   Chief Complaint Chief Complaint  Patient presents with  . Diplopia  . Numbness    HPI Boris SharperJon M Vowell is a 52 y.o. male.  HPI  Pt was seen at 0750. Per pt, c/o unknown onset and persistence of constant "my left eye isn't working right" since yesterday at 7pm. Pt states he was driving in his car when he noticed he had "double vision" when he looked to the left. Pt states this morning when he woke up he noticed: double vision looking both left and forward, word finding difficulties, RUE and RLE weakness and tingling/numbness. Pt states he feels like his right leg is weak when he walks.  the double vision is horizontal, improves when covering one eye. Denies injury, no eye pain, no headache, no ear pain, no neck pain, no fever, no rash, no CP/SOB, no abd pain, no N/V/D.   Past Medical History:  Diagnosis Date  . Arthritis    "knees" (08/01/2017)  . Coronary artery disease   . GERD (gastroesophageal reflux disease)   . High cholesterol   . Hypertension   . STEMI (ST elevation myocardial infarction) (HCC) 07/30/2017   PCI/DES x1 to mLAD with staged intervention to the pRCA, and Lcx. Normal EF  . Type II diabetes mellitus [facility](HCC)     Patient Active Problem List   Diagnosis Date Noted  . Tobacco use 08/02/2017  . Coronary artery disease involving native coronary artery of native heart with angina pectoris (HCC)   . STEMI (ST elevation myocardial infarction) (HCC) 07/30/2017  . Type 2 diabetes mellitus with complication, without long-term current use of insulin (HCC) 07/30/2017  . Hypertension, essential 07/30/2017  . Hyperlipidemia LDL goal <70 07/30/2017  . Acute ST elevation myocardial infarction (STEMI) involving left anterior descending (LAD) coronary artery (HCC) 07/30/2017    Past Surgical History:  Procedure Laterality Date  . CORONARY STENT INTERVENTION N/A  07/30/2017   Procedure: CORONARY STENT INTERVENTION;  Surgeon: Lyn RecordsSmith, Henry W, MD;  [facility];  Service: Cardiovascular;  Laterality: N/A;  . CORONARY STENT INTERVENTION N/A 08/01/2017   Procedure: CORONARY STENT INTERVENTION;  Surgeon: Marykay LexHarding, David W, MD;  [facility];  Service: Cardiovascular;  Laterality: N/A;  . CORONARY/GRAFT ACUTE MI REVASCULARIZATION N/A 07/30/2017   Procedure: Coronary/Graft Acute MI Revascularization;  Surgeon: Lyn RecordsSmith, Henry W, MD;  Location: [facility] INVASIVE CV LAB;  Service: Cardiovascular;  Laterality: N/A;  . INGUINAL HERNIA REPAIR Bilateral   . KNEE ARTHROSCOPY Right   . LEFT HEART CATH AND CORONARY ANGIOGRAPHY N/A 07/30/2017   Procedure: LEFT HEART CATH AND CORONARY ANGIOGRAPHY;  Surgeon: Lyn RecordsSmith, Henry W, MD;  Location: [facility] INVASIVE CV LAB;  Service: Cardiovascular;  Laterality: N/A;  . LEFT HEART CATH AND CORONARY ANGIOGRAPHY N/A 08/01/2017   Procedure: LEFT HEART CATH AND CORONARY ANGIOGRAPHY;  Surgeon: Marykay LexHarding, David W, MD;  [facility];  Service: Cardiovascular;  Laterality: N/A;        Home Medications    Prior to Admission medications   Medication Sig Start Date End Date Taking? Authorizing Provider  aspirin 81 MG chewable tablet Chew 1 tablet (81 mg total) by mouth daily. 08/02/17   Arty Baumgartneroberts, Lindsay B, NP  atorvastatin (LIPITOR) 80 MG tablet TAKE ONE TABLET BY MOUTH DAILY AT 6 PM. 11/08/17   Bhagat, Sharrell KuBhavinkumar, PA  clopidogrel (PLAVIX) 75 MG tablet  Take 1 tablet (75 mg total) by mouth daily with breakfast. 08/02/17   Arty Baumgartner, NP  fexofenadine (ALLEGRA) 60 MG tablet Take 60 mg by mouth daily as needed for allergies or rhinitis.    [provider]  fluticasone (FLONASE) 50 MCG/ACT nasal spray Place 1 spray into both nostrils as needed for congestion. 07/25/17   [provider]  lisinopril-hydrochlorothiazide (PRINZIDE,ZESTORETIC) 20-25 MG tablet Take 1 tablet by mouth daily. 08/30/17   Arty Baumgartner, NP  metFORMIN (GLUCOPHAGE-XR) 500 MG 24 hr tablet Take 500 mg by mouth 2 (two) times daily.  07/18/17   [provider]  metoprolol tartrate (LOPRESSOR) 50 MG tablet TAKE ONE TABLET BY MOUTH TWICE DAILY. 08/29/17   Lyn Records, MD  Multiple Vitamins-Minerals (EMERGEN-C IMMUNE PO) Take 1 packet by mouth daily.    [provider]  nitroGLYCERIN (NITROSTAT) 0.4 MG SL tablet Place 1 tablet (0.4 mg total) under the tongue every 5 (five) minutes as needed. Patient not taking: Reported on 11/07/2017 08/02/17   Laverda Page B, NP  sodium chloride (OCEAN) 0.65 % SOLN nasal spray Place 1 spray into both nostrils as needed for congestion.    [provider]  TRAVEL SICKNESS 25 MG CHEW Chew 25 mg by mouth as needed for dizziness. 05/03/17   [provider]    Family History No family history on file.  Social History Social History   Tobacco Use  . Smoking status: Former Smoker    Packs/day: 1.00    Years: 36.00    Pack years: 36.00    Types: Cigarettes    Last attempt to quit: 07/29/2017    Years since quitting: 0.3  . Smokeless tobacco: Never Used  Substance Use Topics  . Alcohol use: Not Currently  . Drug use: Not Currently     Allergies   Bee venom   Review of Systems Review of Systems ROS: Statement: All systems negative except as marked or noted in the HPI; Constitutional: Negative for fever and chills. ; ; Eyes: Negative for eye pain, redness and discharge. ; ; ENMT: Negative for ear pain, hoarseness, nasal congestion, sinus pressure and sore throat. ; ; Cardiovascular: Negative for chest pain, palpitations, diaphoresis, dyspnea and peripheral edema. ; ; Respiratory: Negative for cough, wheezing and stridor. ; ; Gastrointestinal: Negative for nausea, vomiting, diarrhea, abdominal pain, blood in stool, hematemesis, jaundice and rectal bleeding. . ; ; Genitourinary: Negative for dysuria, flank pain and hematuria. ; ; Musculoskeletal: Negative  for back pain and neck pain. Negative for swelling and trauma.; ; Skin: Negative for pruritus, rash, abrasions, blisters, bruising and skin lesion.; ; Neuro: +double vision, unable to look left with left eye, "trouble word finding," RUE and RLE weakness and paresthesias.  Negative for headache, lightheadedness and neck stiffness. Negative for altered level of consciousness, altered mental status, involuntary movement, seizure and syncope.      Physical Exam Updated Vital Signs BP (!) 176/112 (BP Location: Left Arm)   Pulse (!) 58   Temp 98.1 F (36.7 C) (Oral)   Resp 18   Ht 5\' 10"  (1.778 m)   Wt 81.6 kg (180 lb)   SpO2 98%   BMI 25.83 kg/m    BP (!) 168/112   Pulse (!) 58   Temp 98.1 F (36.7 C) (Oral)   Resp 18   Ht 5\' 10"  (1.778 m)   Wt 81.6 kg (180 lb)   SpO2 98%   BMI 25.83 kg/m  Physical Exam 0755: Physical examination:  Nursing notes reviewed; Vital signs and O2 SAT reviewed;  Constitutional: Well developed, Well nourished, Well hydrated, In no acute distress; Head:  Normocephalic, atraumatic; Eyes: EOMI right eye. Pt unable to gaze left with left eye. PERRL, No scleral icterus; ENMT: Mouth and pharynx normal, Mucous membranes moist; Neck: Supple, Full range of motion, No lymphadenopathy; Cardiovascular: Regular rate and rhythm, No gallop; Respiratory: Breath sounds clear & equal bilaterally, No wheezes.  Speaking full sentences with ease, Normal respiratory effort/excursion; Chest: Nontender, Movement normal; Abdomen: Soft, Nontender, Nondistended, Normal bowel sounds; Genitourinary: No CVA tenderness; Extremities: Peripheral pulses normal, No tenderness, No edema, No calf edema or asymmetry.; Neuro: AA&Ox3, +with exception left 6th nerve palsy above, major CN grossly intact. No facial droop. Speech clear. No nystagmus. Grips L>R. Strength 5/5 LUE and LLE; 4/5 RUE and RLE.  DTR 2/4 equal bilat UE's and LE's. +subjective "tingling" RUE and RLE, no gross sensory deficits.   Normal cerebellar testing bilat LE's (heel-shin), ataxic R>L UE's (finger-nose)..; Skin: Color normal, Warm, Dry.    ED Treatments / Results  Labs (all labs ordered are listed, but only abnormal results are displayed)   EKG None  Radiology   Procedures Procedures (including critical care time)  Medications Ordered in ED Medications - No data to display   Initial Impression / Assessment and Plan / ED Course  I have reviewed the triage vital signs and the nursing notes.  Pertinent labs & imaging results that were available during my care of the patient were reviewed by me and considered in my medical decision making (see chart for details).  MDM Reviewed: previous chart, nursing note and vitals Reviewed previous: labs and ECG Interpretation: labs, ECG and MRI Consults: neurology    ED ECG REPORT   Date: 11/18/2017  Rate: 56  Rhythm: normal sinus rhythm  QRS Axis: left  Intervals: normal  ST/T Wave abnormalities: nonspecific ST/T changes  Conduction Disutrbances:none  Narrative Interpretation:   Old EKG Reviewed: changes noted; T-wave inversion anterior-lateral leads is less pronounced compared to previous EKG dated 08/16/2017. I have personally reviewed the EKG tracing and agree with the computerized printout as noted.   Results for orders placed or performed during the hospital encounter of 11/18/17  Ethanol  Result Value Ref Range   Alcohol, Ethyl (B) <10 <10 mg/dL  Protime-INR  Result Value Ref Range   Prothrombin Time 12.6 11.4 - 15.2 seconds   INR 0.96   APTT  Result Value Ref Range   aPTT 28 24 - 36 seconds  CBC  Result Value Ref Range   WBC 8.5 4.0 - 10.5 K/uL   RBC 6.00 (H) 4.22 - 5.81 MIL/uL   Hemoglobin 18.3 (H) 13.0 - 17.0 g/dL   HCT 96.0 45.4 - 09.8 %   MCV 86.7 78.0 - 100.0 fL   MCH 30.5 26.0 - 34.0 pg   MCHC 35.2 30.0 - 36.0 g/dL   RDW 11.9 14.7 - 82.9 %   Platelets 119 (L) 150 - 400 K/uL  Differential  Result Value Ref Range    Neutrophils Relative % 65 %   Neutro Abs 5.7 1.7 - 7.7 K/uL   Lymphocytes Relative 25 %   Lymphs Abs 2.1 0.7 - 4.0 K/uL   Monocytes Relative 8 %   Monocytes Absolute 0.6 0.1 - 1.0 K/uL   Eosinophils Relative 1 %   Eosinophils Absolute 0.1 0.0 - 0.7 K/uL   Basophils Relative 1 %   Basophils Absolute 0.1  0.0 - 0.1 K/uL  Comprehensive metabolic panel  Result Value Ref Range   Sodium 136 135 - 145 mmol/L   Potassium 3.6 3.5 - 5.1 mmol/L   Chloride 99 98 - 111 mmol/L   CO2 26 22 - 32 mmol/L   Glucose, Bld 283 (H) 70 - 99 mg/dL   BUN 15 6 - 20 mg/dL   Creatinine, Ser 1.61 0.61 - 1.24 mg/dL   Calcium 9.8 8.9 - 09.6 mg/dL   Total Protein 7.7 6.5 - 8.1 g/dL   Albumin 4.3 3.5 - 5.0 g/dL   AST 16 15 - 41 U/L   ALT 24 0 - 44 U/L   Alkaline Phosphatase 75 38 - 126 U/L   Total Bilirubin 1.4 (H) 0.3 - 1.2 mg/dL   GFR calc non Af Amer >60 >60 mL/min   GFR calc Af Amer >60 >60 mL/min   Anion gap 11 5 - 15  Urinalysis, Routine w reflex microscopic  Result Value Ref Range   Color, Urine STRAW (A) YELLOW   APPearance CLEAR CLEAR   Specific Gravity, Urine 1.006 1.005 - 1.030   pH 6.0 5.0 - 8.0   Glucose, UA >=500 (A) NEGATIVE mg/dL   Hgb urine dipstick NEGATIVE NEGATIVE   Bilirubin Urine NEGATIVE NEGATIVE   Ketones, ur NEGATIVE NEGATIVE mg/dL   Protein, ur NEGATIVE NEGATIVE mg/dL   Nitrite NEGATIVE NEGATIVE   Leukocytes, UA NEGATIVE NEGATIVE   RBC / HPF 0-5 0 - 5 RBC/hpf   WBC, UA 0-5 0 - 5 WBC/hpf   Bacteria, UA NONE SEEN NONE SEEN  Troponin I  Result Value Ref Range   Troponin I <0.03 <0.03 ng/mL   Mr Brain Wo Contrast (neuro Protocol) Result Date: 11/18/2017 CLINICAL DATA:  Left eye sixth nerve palsy. Right-sided weakness. Diplopia. Focal neuro deficit, greater than 6 hours, stroke suspected. EXAM: MRI HEAD WITHOUT CONTRAST TECHNIQUE: Multiplanar, multiecho pulse sequences of the brain and surrounding structures were obtained without intravenous contrast. COMPARISON:  CT head without  contrast 09/09/2012. MRI brain 03/24/2007. FINDINGS: Brain: Acute left paramedian inferior pontine infarct likely impacts the left sixth nerve nucleus or white matter tract. There is no associated hemorrhage or mass lesion. No supratentorial infarcts are present. T2 signal changes are associated with the area of acute infarction. Mild periventricular and subcortical white matter changes are noted bilaterally. The ventricles are of normal size. No significant extra-axial fluid collection is present. Remote lacunar infarcts are present in the cerebellum bilaterally. Vascular: Flow is present in the major intracranial arteries. Skull and upper cervical spine: The skull base is within normal limits. The craniocervical junction is normal. The visualized intracranial contents crash that Sinuses/Orbits: The paranasal sinuses and mastoid air cells are clear. Globes and orbits are within normal limits. IMPRESSION: 1. Acute/subacute left paramedian inferior pontine infarct likely impacts the left sixth nerve nucleus or nerve. This corresponds with the patient's M2 since palsy and diplopia. 2. No other acute infarct. 3. Remote lacunar infarcts of the cerebellum bilaterally. 4. Periventricular and subcortical white matter changes are mildly advanced for age. This likely reflects the sequela of chronic microvascular ischemia. These results were called by telephone at the time of interpretation on 11/18/2017 at 9:48 am to Dr. Samuel Jester , who verbally acknowledged these results. Electronically Signed   By: Marin Roberts M.D.   On: 11/18/2017 09:51    Mr Maxine Glenn Head (cerebral Arteries) Result Date: 11/18/2017 CLINICAL DATA:  Diplopia.  Focal neuro deficit. EXAM: MRA HEAD WITHOUT CONTRAST TECHNIQUE: Angiographic  images of the Circle of Willis were obtained using MRA technique without intravenous contrast. COMPARISON:  CT head without contrast/17/14 FINDINGS: The right internal carotid artery is tortuous in the cervical  ICA without a significant stenosis. Atherosclerotic changes are present within the cavernous internal carotid arteries bilaterally without a significant stenosis relative to the more distal ICA terminus. The A1 and M1 segments are normal. The anterior communicating artery is patent. MCA bifurcations are intact. There is some attenuation of ACA branch vessels bilaterally. The vertebral arteries are within normal limits bilaterally. PICA origins are visualized and normal. There is a moderate high-grade distal left vertebral artery stenosis. A mild to moderate right vertebral artery stenosis is present. Both of these are at the vertebrobasilar junction. The basilar artery is normal. Moderate P2 segment stenoses are present bilaterally. IMPRESSION: 1. Moderate to severe distal left vertebral artery stenosis at the vertebrobasilar junction. 2. Mild to moderate distal right vertebral artery stenosis at the vertebrobasilar junction. 3. Moderate P2 segment stenoses within the PCA vessels bilaterally. 4. Atherosclerotic changes within the internal carotid arteries without significant stenosis proximally in the anterior circulation. 5. Distal small vessel disease within the ACA and MCA branch vessels. Electronically Signed   By: Marin Roberts M.D.   On: 11/18/2017 09:45    0950:  MRI as above; will consult TeleNeuro MD.  Dx and testing d/w pt and family.  Questions answered.  Verb understanding, agreeable to Neuro MD consult and admit.   1020:  TeleNeuro MD has evaluated pt (see consult note): recommends MRA neck now, admit for further stroke workup, including tighter control of BP and glucose (pt admitted that both have been elevated at home), allow permissive HTN for now.   1110:  T/C returned from [facility] Dr. Ardyth Harps, case discussed, including:  HPI, pertinent PM/SHx, VS/PE, dx testing, ED course and treatment:  Agreeable to admit.       Final Clinical Impressions(s) / ED Diagnoses   Final diagnoses:    None    ED Discharge Orders    None       Samuel Jester, DO 11/20/17 [phone]

## 2017-11-18 NOTE — ED Triage Notes (Signed)
Pt reports approx 7pm, unsure of exact time noticed double vision and difficulty driving due to peripheral vision in left eye. Pt reports he was unable to see using peripheral vision. Reports worsening this am and unsteady due to vision. Also reports when he woke up he noticed left arm and leg with numbness and tingling

## 2017-11-18 NOTE — Consult Note (Signed)
[facility] Tele[facility]urology Consult Services   STAT Neurology Consult  Date of Service: 11/18/2017   Chief Complaint: Diplopia and right sided numbness   HPI: Asked to see this patient in telemedicine consultation. ?Consultation was performed with assistance of ancillary / medical staff at bedside.   Verbal consent to perform the examination with telemedicine was obtained. Patient agreed to proceed with the consultation.  52 year old right-handed white male who comes to the emergency room for evaluation of double vision, gait problems, and right-sided numbness.  Patient with a recent ST elevation MI in April, and is currently on aspirin and Plavix.  He is also on some type of cardiac study drug as well.  Patient has never had a stroke before.  Patient started noticing problems with his vision yesterday around 7 PM while driving home.  When turning his head to the left, he had double vision.  When he got home, he felt better.  He then woke up at 4 AM this morning, and had trouble walking and mostly had right arm and leg numbness and paresthesias.  He also had persistent double vision.    When he came into the ER, he was noted to be hypertensive at 168/112 and blood sugar 283.  Patient states that he has had labile blood pressures and blood sugar since his MI in April.  MRI brain showed an acute left paramedian pontine stroke.   PMH: Hypertension, hyperlipidemia, diabetes mellitus, GERD, osteoarthritis, and coronary artery disease with recent MI in April 2019   SOC: Negative x2.  Patient drinks occasional alcohol.  He smoked before in the past.  He is married and lives with his family.   FMH: Negative for stroke.   ROS: 13 point review systems were reviewed with the patient, and are all negative with the exception of the aforementioned in the history of present illness.   VS: Temperature is 98.1 F, respiration 18, blood pressure 160/112, oxygen saturation 98%   Exam: Patient is in no  apparent distress.  Patient appears as stated age.  No obvious acute respiratory or cardiac distress.  Patient is well groomed and well-nourished. 1a- LOC: Keenly responsive - 0     1b- LOC questions: Answers both questions correctly - 0    1c- LOC commands- Performs both tasks correctly- 0    2- Gaze: Left lateral gaze paresis - 1    3- Visual Fields: normal, no Visual field deficit - 0    4- Facial movements: no facial palsy - 0    5- Upper limb motor - no drift - 0    6- Lower limb motor - right leg drift - 1     7- Limb Coordination: absent ataxia - 0     8- Sensory: right sensory loss - 1     9- Language - No aphasia - 0     10- Speech - No dysarthria -0    11- Neglect / Extinction - none found - 0   NIHSS score: 3    Diagnostic Data: Alcohol level negative, coagulation studies within normal limits, urinalysis negative, troponin negative x1, WBC 8.5, hemoglobin 18.3, platelets 119, LFTs within normal limits, sodium 136, potassium 3.6, BUN 15, creatinine 0.81, blood glucose 283  MRI of the brain showed an acute left paramedian pontine stroke.  No acute hemorrhage or mass-effect.  MRA of the head showed moderate to severe left distal vertebral artery stenosis, mild/moderate right vertebral artery stenosis near the vertebrobasilar junction, and moderate bilateral P2 segment stenosis.  Medical Data Reviewed:   1.Data?reviewed include clinical labs, radiology,?and medical tests;   2.Tests?results discussed w/performing or interpreting physician;   3.Obtaining/reviewing old medical records;   4.Obtaining?case history from another source;   5.Independent?review of image, tracing, or specimen.    Medical Decision Making:   - Extensive number of diagnosis or management options are considered below.   - Extensive amount of complex data reviewed.   - High risk of complication and/or morbidity or mortality are associated with differential diagnostic considerations below.   - There may  be?uncertain?outcome and increased probability of prolonged functional impairment or high probability of severe prolonged functional impairment associated with some of these differential diagnosis.    Differential Diagnosis for Stroke:   1.?Cardioembolic?stroke   2. Small vessel disease/lacune   3. Thromboembolic, artery-to-artery mechanism   4.?Hypercoagulable?state-related infarct   5. Transient ischemic attack   6. Thrombotic mechanism, large artery disease    Assessment: 1.  Acute left paramedian pontine stroke likely due to chronic small vessel disease versus symptomatic vertebral artery stenosis. 2.  Intracranial atherosclerotic disease. 3.  Coronary artery disease with recent MI in April 2019 4.  Hypertension 5.  Hyperlipidemia 6.  Diabetes mellitus   Recommendations: Patient can be admitted to the hospital for further work-up of his symptoms. Allow permissive hypertension. Maintain the patient on aspirin 325 mg daily and Plavix. Place him on a high dose statin. Check MRA of the neck with and without contrast to complete his vascular imaging. Maintain the patient on telemetry to look for paroxysmal atrial fibrillation. Check hemoglobin A1c and lipid panel. Consult PT, OT, ST. Check echocardiogram to gauge his cardiac function. Consult local neurology team to assist with evaluation management. No acute role for anticoagulation. Continue supportive care. Plan of care was discussed with the patient.   Thank you for allowing [facility] to participate in the care of your patient. Please call me, Dr. Adrienne MochaSombutmai, with any questions at [phone]. Case discussed with the ER staff and Dr. Clarene DukeMcManus.

## 2017-11-18 NOTE — Progress Notes (Signed)
Pt still c/o headache after getting Tylenol, On-call made aware and gave order for Norco/Vicodin, was administered and pt responded well, call light in reach, resting at this time

## 2017-11-18 NOTE — ED Notes (Addendum)
Neurologist on Teleneuro at bedside.

## 2017-11-19 ENCOUNTER — Inpatient Hospital Stay (HOSPITAL_COMMUNITY): Payer: Federal, State, Local not specified - PPO

## 2017-11-19 DIAGNOSIS — I10 Essential (primary) hypertension: Secondary | ICD-10-CM | POA: Diagnosis not present

## 2017-11-19 DIAGNOSIS — I5030 Unspecified diastolic (congestive) heart failure: Secondary | ICD-10-CM | POA: Diagnosis not present

## 2017-11-19 DIAGNOSIS — I2102 ST elevation (STEMI) myocardial infarction involving left anterior descending coronary artery: Secondary | ICD-10-CM

## 2017-11-19 DIAGNOSIS — E785 Hyperlipidemia, unspecified: Secondary | ICD-10-CM | POA: Diagnosis not present

## 2017-11-19 DIAGNOSIS — I25119 Atherosclerotic heart disease of native coronary artery with unspecified angina pectoris: Secondary | ICD-10-CM | POA: Diagnosis not present

## 2017-11-19 DIAGNOSIS — Z720 Tobacco use: Secondary | ICD-10-CM

## 2017-11-19 DIAGNOSIS — I6389 Other cerebral infarction: Secondary | ICD-10-CM

## 2017-11-19 LAB — GLUCOSE, CAPILLARY
Glucose-Capillary: 173 mg/dL — ABNORMAL HIGH (ref 70–99)
Glucose-Capillary: 167 mg/dL — ABNORMAL HIGH (ref 70–99)

## 2017-11-19 LAB — LIPID PANEL
Cholesterol: 142 mg/dL (ref 0–200)
HDL: 34 mg/dL — ABNORMAL LOW (ref >40)
LDL Cholesterol: 53 mg/dL (ref 0–99)
Total CHOL/HDL Ratio: 4.2 RATIO
Triglycerides: 273 mg/dL — ABNORMAL HIGH (ref <150)
VLDL: 55 mg/dL — ABNORMAL HIGH (ref 0–40)

## 2017-11-19 LAB — ECHOCARDIOGRAM COMPLETE
Height: 70 in
Weight: 2880 oz

## 2017-11-19 LAB — HEMOGLOBIN A1C
Hgb A1c MFr Bld: 10.2 % — ABNORMAL HIGH (ref 4.8–5.6)
Mean Plasma Glucose: 246.04 mg/dL

## 2017-11-19 LAB — PLATELET INHIBITION P2Y12: Platelet Function  P2Y12: 55 [PRU] — ABNORMAL LOW (ref 194–418)

## 2017-11-19 LAB — HIV ANTIBODY (ROUTINE TESTING W REFLEX): HIV Screen 4th Generation wRfx: NONREACTIVE

## 2017-11-19 MED ORDER — INSULIN GLARGINE 100 UNIT/ML ~~LOC~~ SOLN
10.0000 [IU] | Freq: Every day | SUBCUTANEOUS | Status: DC
  Filled 2017-11-19: qty 0.1

## 2017-11-19 MED ORDER — GLIPIZIDE 5 MG PO TABS: 5.0000 mg | ORAL_TABLET | Freq: Two times a day (BID) | ORAL | 11 refills | Status: AC

## 2017-11-19 MED ORDER — ASPIRIN 325 MG PO TABS: 325.0000 mg | ORAL_TABLET | Freq: Every day | ORAL | Status: DC

## 2017-11-19 NOTE — Progress Notes (Signed)
SLP Cancellation Note  Patient Details Name: Matthew Walker MRN: [id number] DOB: Jul 25, 1965   Cancelled treatment:       Reason Eval/Treat Not Completed: SLP screened, no needs identified, will sign off   Angela NevinJohn T. Blanche Gallien, MA, CCC-SLP 11/19/17 2:02 PM

## 2017-11-19 NOTE — Consult Note (Signed)
NEURO HOSPITALIST    Requesting Physician: Dr. Jerolyn CenterMathews    Chief Complaint: diplopia  History obtained from:  Patient     HPI:                                                                                                                                         Matthew Walker is an 52 y.o. male   with history significant for hypertension, hyperlipidemia, type 2 diabetes, history of ST elevated MI April 2019 with PCI/DES x1 to LAD ( on ASA and Plavix) who [facility]  as a [facility] with 1 day history of diplopia, blurred vision when looking to the left.  Patient states that on Thursday evening 11/17/17 while pulling into the driveway he had a sudden onset of blurred/ double vision. With this episode he reports that he also had tingling in the right hand, arm and leg. He also reports mild right side weakness. He states that no one would have noticed the weakness, but he felt it. Friday morning the symptoms became worse  And he had dizziness so he decided to come to the ED. He states that he has had a previous episode like this before his heart attack in April, but the vision problem was new. Denies gait instability,  HA, n/v, CP, SOB. Denies missing any medication doses and has been on ASA and Plavix since April. Note patient  involved in a statin clinical trial and is still following-up for this.   Hospital course:  BP: Last 181/92 BG last:173 HDL: 34 Triglycerides: 273 Platelets: 132 HgbA1c: 10.3  MRI brain:  Acute/subacute left paramedian inferior pontine infarct likely impacts the left sixth nerve nucleus or nerve  No other acute infarct. Remote lacunar infarcts of the  bilateral cerebellum.  MRA head and Neck: 1. Moderate to severe distal left vertebral artery stenosis at the vertebrobasilar junction. 2. Mild to moderate distal right vertebral artery stenosis at the vertebrobasilar junction. 3. Moderate P2 segment stenoses  within the PCA vessels bilaterally. 4. Atherosclerotic changes within the internal carotid arteries without significant stenosis proximally in the anterior circulation. 5. Distal small vessel disease within the ACA and MCA branch vessels.   No previous history of stroke   Date last known well: Date: 11/17/2017; Thursday  Time last known well: Time: 18:30 tPA Given: No: contraindicated; outside of windowNo Symptoms    Modified Rankin: Rankin Score=0  NIHSS:1 1a Level of Conscious:0 1b LOC Questions: 0 1c LOC Commands: 0 2 Best Gaze: 0 3 Visual: 0 4 Facial Palsy: 1 5a Motor Arm - left: 0 5b Motor Arm -  Right:0 6a Motor Leg - Left: 0 6b Motor Leg - Right: 0 7 Limb Ataxia: 0 8 Sensory: 0 9 Best Language: 0 10 Dysarthria:0 11 Extinct. and Inattention:0 TOTAL: 1   Past Medical History:  Diagnosis Date  . Arthritis    "knees, hands" (11/18/2017)  . Coronary artery disease   . GERD (gastroesophageal reflux disease)   . High cholesterol   . Hypertension   . STEMI (ST elevation myocardial infarction) (HCC) 07/30/2017   PCI/DES x1 to mLAD with staged intervention to the pRCA, and Lcx. Normal EF  . Stroke (HCC) 11/18/2017   Acute/subacute left pontine CVA/notes 11/18/2017  . Type II diabetes mellitus (HCC)     Past Surgical History:  Procedure Laterality Date  . CORONARY STENT INTERVENTION N/A 07/30/2017   Procedure: CORONARY STENT INTERVENTION;  Surgeon: Lyn Records, MD;  Location: [facility] INVASIVE CV LAB;  Service: Cardiovascular;  Laterality: N/A;  . CORONARY STENT INTERVENTION N/A 08/01/2017   Procedure: CORONARY STENT INTERVENTION;  Surgeon: Marykay Lex, MD;  Location: [facility] INVASIVE CV LAB;  Service: Cardiovascular;  Laterality: N/A;  . CORONARY/GRAFT ACUTE MI REVASCULARIZATION N/A 07/30/2017   Procedure: Coronary/Graft Acute MI Revascularization;  Surgeon: Lyn Records, MD;  Location: [facility] INVASIVE CV LAB;  Service: Cardiovascular;  Laterality: N/A;  . INGUINAL HERNIA  REPAIR Bilateral   . KNEE ARTHROSCOPY Right   . LEFT HEART CATH AND CORONARY ANGIOGRAPHY N/A 07/30/2017   Procedure: LEFT HEART CATH AND CORONARY ANGIOGRAPHY;  Surgeon: Lyn Records, MD;  Location: [facility] INVASIVE CV LAB;  Service: Cardiovascular;  Laterality: N/A;  . LEFT HEART CATH AND CORONARY ANGIOGRAPHY N/A 08/01/2017   Procedure: LEFT HEART CATH AND CORONARY ANGIOGRAPHY;  Surgeon: Marykay Lex, MD;  Location: [facility] INVASIVE CV LAB;  Service: Cardiovascular;  Laterality: N/A;    History reviewed. No pertinent family history.       Social History:  reports that he quit smoking about 3 months ago. His smoking use included cigarettes. He has a 36.00 pack-year smoking history. He has quit using smokeless tobacco. His smokeless tobacco use included chew. He reports that he drank alcohol. He reports that he has current or past drug history.  Allergies:  Allergies  Allergen Reactions  . Bee Venom Other (See Comments)    Hay Fever    Medications:                                                                                                                           Scheduled: . aspirin  325 mg Oral Daily  . atorvastatin  80 mg Oral q1800  . clopidogrel  75 mg Oral Q breakfast  . enoxaparin (LOVENOX) injection  40 mg Subcutaneous Q24H  . lisinopril  20 mg Oral Daily   And  . hydrochlorothiazide  25 mg Oral Daily  . insulin aspart  0-15 Units Subcutaneous TID WC  . insulin aspart  0-5 Units Subcutaneous QHS  . insulin aspart  4  Units Subcutaneous TID WC  . metoprolol tartrate  50 mg Oral BID   Continuous: . sodium chloride 75 mL/hr at 11/19/17 1610   RUE:[id number] **OR** acetaminophen (TYLENOL) oral liquid 160 mg/5 mL **OR** acetaminophen, HYDROcodone-acetaminophen, senna-docusate   ROS:                                                                                                                                       History obtained from the patient  General ROS: negative for  - chills, fatigue, fever, night sweats, weight gain or weight loss Psychological ROS: negative for - , hallucinations, memory difficulties, mood swings or  Ophthalmic ROS: negative for - blurry vision, double vision, eye pain or loss of vision ENT ROS: negative for - epistaxis, nasal discharge, oral lesions, sore throat, tinnitus or vertigo Respiratory ROS: negative for - cough,  shortness of breath or wheezing Cardiovascular ROS: negative for - chest pain, dyspnea on exertion,  Gastrointestinal ROS: negative for - abdominal pain, diarrhea,  nausea/vomiting or stool incontinence Genito-Urinary ROS: negative for - dysuria, hematuria, incontinence or urinary frequency/urgency Musculoskeletal ROS: negative for - joint swelling or muscular weakness Neurological ROS: as noted in HPI   General Examination:                                                                                                      Blood pressure (!) 168/103, pulse (!) 47, temperature 97.9 F (36.6 C), temperature source Oral, resp. rate 14, height 5\' 10"  (1.778 m), weight 81.6 kg (180 lb), SpO2 97 %.  HEENT-  Normocephalic, no lesions, without obvious abnormality.  Normal external eye and conjunctiva.  Cardiovascular-  pulses palpable throughout  Lungs- no excessive working breathing.  Saturations within normal limits on RA Extremities- Warm, dry and intact Musculoskeletal-no joint tenderness, deformity or swelling Skin-warm and dry, intact  Neurological Examination Mental Status: Alert, oriented, person/place/year/month/age/situation.  Speech fluent without evidence of aphasia.  Able to follow commands without difficulty. Cranial Nerves: GN:FAOZHY fields grossly normal,  III,IV, VI: ptosis not present, disconjugate gaze, left eye does not cross midline to left. Left eye in and down,  extra-ocular motions intact right side, pupils equal, round, reactive to light and accommodation V,VII: smile asymmetric,mild right side  facial droop, facial light touch sensation normal bilaterally VIII: hearing normal bilaterally IX,X: uvula rises symmetrically XI: bilateral shoulder shrug XII: midline tongue extension Motor: Right : Upper extremity   5/5    Left:     Upper extremity  5/5  Lower extremity   5/5     Lower extremity   5/5 Tone and bulk:normal tone throughout; no atrophy noted Sensory:  light touch intact throughout, bilaterally Deep Tendon Reflexes: 2+ and symmetric throughout Plantars: Right: downgoing   Left: downgoing Cerebellar: normal finger-to-nose, normal rapid alternating movements and normal heel-to-shin test Gait: mild gait instability noted (watch him walk with PT)   Lab Results: Basic Metabolic Panel: Recent Labs  Lab 11/18/17 0844 11/18/17 1558  NA 136  --   K 3.6  --   CL 99  --   CO2 26  --   GLUCOSE 283*  --   BUN 15  --   CREATININE 0.81 0.85  CALCIUM 9.8  --     CBC: Recent Labs  Lab 11/18/17 0844 11/18/17 1558  WBC 8.5 8.9  NEUTROABS 5.7  --   HGB 18.3* 16.6  HCT 52.0 49.2  MCV 86.7 86.8  PLT 119* 132*    Lipid Panel: Recent Labs  Lab 11/19/17 0407  CHOL 142  TRIG 273*  HDL 34*  CHOLHDL 4.2  VLDL 55*  LDLCALC 53    CBG: Recent Labs  Lab 11/18/17 1638 11/18/17 1832 11/18/17 2141 11/19/17 0617  GLUCAP 124* 213* 192* 173*    Imaging: Mr Mra Neck W Wo Contrast  Result Date: 11/18/2017 CLINICAL DATA:  Stroke EXAM: MRA NECK WITHOUT AND WITH CONTRAST TECHNIQUE: Multiplanar and multiecho pulse sequences of the neck were obtained without and with intravenous contrast. Angiographic images of the neck were obtained using MRA technique without and with intravenous contrast. CONTRAST:  17mL MULTIHANCE GADOBENATE DIMEGLUMINE 529 MG/ML IV SOLN COMPARISON:  MRI/MRA head 11/18/2017 FINDINGS: Antegrade flow in both carotid and vertebral arteries. Carotid bifurcation is normal bilaterally without stenosis or irregularity. Internal carotid artery normal  bilaterally. Both vertebral arteries are patent to the basilar. There is a moderate stenosis of the distal left vertebral artery as noted on prior MRA head IMPRESSION: Negative for carotid stenosis Moderate stenosis distal left vertebral artery. Electronically Signed   By: Marlan Palau M.D.   On: 11/18/2017 12:00   Mr Brain Wo Contrast (neuro Protocol)  Result Date: 11/18/2017 CLINICAL DATA:  Left eye sixth nerve palsy. Right-sided weakness. Diplopia. Focal neuro deficit, greater than 6 hours, stroke suspected. EXAM: MRI HEAD WITHOUT CONTRAST TECHNIQUE: Multiplanar, multiecho pulse sequences of the brain and surrounding structures were obtained without intravenous contrast. COMPARISON:  CT head without contrast 09/09/2012. MRI brain 03/24/2007. FINDINGS: Brain: Acute left paramedian inferior pontine infarct likely impacts the left sixth nerve nucleus or white matter tract. There is no associated hemorrhage or mass lesion. No supratentorial infarcts are present. T2 signal changes are associated with the area of acute infarction. Mild periventricular and subcortical white matter changes are noted bilaterally. The ventricles are of normal size. No significant extra-axial fluid collection is present. Remote lacunar infarcts are present in the cerebellum bilaterally. Vascular: Flow is present in the major intracranial arteries. Skull and upper cervical spine: The skull base is within normal limits. The craniocervical junction is normal. The visualized intracranial contents crash that Sinuses/Orbits: The paranasal sinuses and mastoid air cells are clear. Globes and orbits are within normal limits. IMPRESSION: 1. Acute/subacute left paramedian inferior pontine infarct likely impacts the left sixth nerve nucleus or nerve. This corresponds with the patient's M2 since palsy and diplopia. 2. No other acute infarct. 3. Remote lacunar infarcts of the cerebellum bilaterally. 4. Periventricular and subcortical white matter  changes are mildly advanced for  age. This likely reflects the sequela of chronic microvascular ischemia. These results were called by telephone at the time of interpretation on 11/18/2017 at 9:48 am to Dr. Samuel Jester , who verbally acknowledged these results. Electronically Signed   By: Marin Roberts M.D.   On: 11/18/2017 09:51   Mr Maxine Glenn Head (cerebral Arteries)  Result Date: 11/18/2017 CLINICAL DATA:  Diplopia.  Focal neuro deficit. EXAM: MRA HEAD WITHOUT CONTRAST TECHNIQUE: Angiographic images of the Circle of Willis were obtained using MRA technique without intravenous contrast. COMPARISON:  CT head without contrast/17/14 FINDINGS: The right internal carotid artery is tortuous in the cervical ICA without a significant stenosis. Atherosclerotic changes are present within the cavernous internal carotid arteries bilaterally without a significant stenosis relative to the more distal ICA terminus. The A1 and M1 segments are normal. The anterior communicating artery is patent. MCA bifurcations are intact. There is some attenuation of ACA branch vessels bilaterally. The vertebral arteries are within normal limits bilaterally. PICA origins are visualized and normal. There is a moderate high-grade distal left vertebral artery stenosis. A mild to moderate right vertebral artery stenosis is present. Both of these are at the vertebrobasilar junction. The basilar artery is normal. Moderate P2 segment stenoses are present bilaterally. IMPRESSION: 1. Moderate to severe distal left vertebral artery stenosis at the vertebrobasilar junction. 2. Mild to moderate distal right vertebral artery stenosis at the vertebrobasilar junction. 3. Moderate P2 segment stenoses within the PCA vessels bilaterally. 4. Atherosclerotic changes within the internal carotid arteries without significant stenosis proximally in the anterior circulation. 5. Distal small vessel disease within the ACA and MCA branch vessels. Electronically  Signed   By: Marin Roberts M.D.   On: 11/18/2017 09:45       Valentina Lucks, MSN, NP-C [facility] [phone]  11/19/2017, 9:43 AM   Attending physician note to follow with Assessment and plan .   Assessment: 52 y.o. male  with history significant for hypertension, hyperlipidemia, type 2 diabetes, history of ST elevated MI April 2019 with PCI/DES x1 to LAD ( on ASA and Plavix) who presented to [facility]  as a transfer from [place] ED with 1 day history of diplopia, blurred vision when looking to the left. MRI shows Acute/subacute left paramedian inferior pontine infarct likely impacts the left sixth nerve nucleus.    Acute Ischemic Pontine infarction  Stroke Risk Factors - hyperlipidemia, hypertension, smoking and MI Mechanism- likely small vessel disease affecting a perforator blood vessel vs atheroembolic    Recommend -- BP goal : Gradual reduction in blood pressure over 24-hour to 48 hours with normal normotensive goal  --[id number] lab ordered: 55 indicating sufficient antiplatelet suppression with Plavix --MRI Brain (completed) --MRA of the head w/o and neck with contrast (completed) --Echocardiogram -- continue ASA and plavix -- continue lipitor at 80 mg daily - Smoking cessation  -- HgbA1c, fasting lipid panel -- PT consult, OT consult, Speech consult --Telemetry monitoring --Frequent neuro checks --Stroke swallow screen (completed)   --please page stroke NP  Or  PA  Or MD from 8am -4 pm  as this patient from this time will be  followed by the stroke.   You can look them up on [email or web address]  Password [email or web address]   NEUROHOSPITALIST ADDENDUM Seen and examined the patient today. I have reviewed the contents of history and physical exam as documented by PA/ARNP/Resident and agree with above documentation.  I have discussed and formulated the above plan as documented. Edits to the note have been made as  needed.   Patient admitted after having sudden onset double  vision due to VI nerve palsy from a pontine perforator stroke.  Etiology is likely small vessel disease due to uncontrolled risk factors versus athero-embolic in the setting of severe vertebral stenosis as well as intracranial atherosclerotic disease.    Patient already on aspirin and Plavix when he had the  stroke-I checked his P2 I went to and Plavix appears to have sufficient platelet suppression.  I have made a recommend to have continued use of high-dose atorvastatin. Vertebral artery not a location ammeniable to stenting without high risk.  Areas to improve is complete cessation of smoking, exercise.  F/U stroke clinic in 2-4 weeks. Can be discharged if Echo normal and PT/OT clears patient.    Georgiana Spinner Hoa Deriso MD [facility] [phone]   If 7pm to 7am, please call on call as listed on AMION.

## 2017-11-19 NOTE — Progress Notes (Deleted)
PROGRESS NOTE    Matthew Walker  ZOX:[id number] DOB: November 11, 1965 DOA: 11/18/2017 PCP: Estanislado Pandy, MD Brief Narrative:  52 y.o. male with history significant for hypertension, hyperlipidemia, type 2 diabetes, history of ST elevated MI earlier this year with PCI/DES x1 to LAD.  He was apparently enrolled in a statin clinical trial and is still undergoing follow-up for this.  Patient states that yesterday evening at around 630 to 7 PM as he pulled into his driveway at home he noticed significant blurry/double vision when looking to the left.  He thought there was a problem with his contacts and went to bed.  When he woke up this morning and open his eyes he had severe diplopia and dizziness.  He also complained of imbalance while walking but states that this only happened when his eyes were open, if he tried to walk with his eyes closed he did not have an issue.  He decided to come into the hospital for evaluation.  EDP proceeded immediately with an MRI of the brain that showed an acute/subacute left inferior pontine infarct and admission was requested.  Patient and patient's family are very insistent on patient being transferred to [facility].     Assessment & Plan:   Active Problems:   STEMI (ST elevation myocardial infarction) (HCC)   Type 2 diabetes mellitus with complication, without long-term current use of insulin (HCC)   Hypertension, essential   Hyperlipidemia LDL goal <70   Coronary artery disease involving native coronary artery of native heart with angina pectoris (HCC)   Tobacco use   Left pontine CVA (HCC) Acute/subacute left pontine CVA -This is the cause of his diplopia and left VI nerve palsy. -Has already been seen by tele-neurology who recommends admission.  States there is nothing to do about the moderate to severe left vertebral artery stenosis noted on MRA. -Patient has already passed swallow evaluation, will allow diet. -PT/OT. -Is on aspirin and Plavix which we will  continue as per neurology recommendations. -2D echo requested, lipids. -Patient and patient's family are insistent upon transfer to [facility].  Neurology will need to be consulted.  Hypertension -Blood pressure remains elevated, will allow permissive hypertension for 72 hours following acute CVA. -Home blood pressure medications have been restarted nonetheless given his systolics in the 180 range.  Hyperlipidemia -Continue statin.  Type 2 diabetes-start lantus diabetic consult hba1c above 10.         DVT prophylaxis lovenox Code Status:full Family Communication:none Disposition Plan: plan dc in am Consultants: neuro   Procedures:none Antimicrobialsnone Subjective: Still seeing double  Objective: Vitals:   11/18/17 2335 11/19/17 0345 11/19/17 0948 11/19/17 1247  BP: 137/84 (!) 168/103 (!) 181/92 (!) 166/102  Pulse: (!) 49 (!) 47 (!) 51 (!) 54  Resp: 13 14 20 20   Temp: 97.6 F (36.4 C) 97.9 F (36.6 C) 98.6 F (37 C) 98.6 F (37 C)  TempSrc: Oral Oral Oral Oral  SpO2: 95% 97% 97% 97%  Weight:      Height:        Intake/Output Summary (Last 24 hours) at 11/19/2017 1257 Last data filed at 11/19/2017 1246 Gross per 24 hour  Intake 927.81 ml  Output 1 ml  Net 926.81 ml   Filed Weights   11/18/17 0746  Weight: 81.6 kg (180 lb)    Examination:  General exam: Appears calm and comfortable  Respiratory system: Clear to auscultation. Respiratory effort normal. Cardiovascular system: S1 & S2 heard, RRR. No JVD, murmurs, rubs,  gallops or clicks. No pedal edema. Gastrointestinal system: Abdomen is nondistended, soft and nontender. No organomegaly or masses felt. Normal bowel sounds heard. Central nervous system: Alert and oriented. No focal neurological deficits. Extremities: Symmetric 5 x 5 power. Skin: No rashes, lesions or ulcers Psychiatry: Judgement and insight appear normal. Mood & affect appropriate.     Data Reviewed: I have personally reviewed  following labs and imaging studies  CBC: Recent Labs  Lab 11/18/17 0844 11/18/17 1558  WBC 8.5 8.9  NEUTROABS 5.7  --   HGB 18.3* 16.6  HCT 52.0 49.2  MCV 86.7 86.8  PLT 119* 132*   Basic Metabolic Panel: Recent Labs  Lab 11/18/17 0844 11/18/17 1558  NA 136  --   K 3.6  --   CL 99  --   CO2 26  --   GLUCOSE 283*  --   BUN 15  --   CREATININE 0.81 0.85  CALCIUM 9.8  --    GFR: Estimated Creatinine Clearance: 105 mL/min (by C-G formula based on SCr of 0.85 mg/dL). Liver Function Tests: Recent Labs  Lab 11/18/17 0844  AST 16  ALT 24  ALKPHOS 75  BILITOT 1.4*  PROT 7.7  ALBUMIN 4.3   No results for input(s): LIPASE, AMYLASE in the last 168 hours. No results for input(s): AMMONIA in the last 168 hours. Coagulation Profile: Recent Labs  Lab 11/18/17 0844  INR 0.96   Cardiac Enzymes: Recent Labs  Lab 11/18/17 0844  TROPONINI <0.03   BNP (last 3 results) No results for input(s): PROBNP in the last 8760 hours. HbA1C: Recent Labs    11/18/17 1558 11/19/17 0407  HGBA1C 10.3* 10.2*   CBG: Recent Labs  Lab 11/18/17 1638 11/18/17 1832 11/18/17 2141 11/19/17 0617 11/19/17 1142  GLUCAP 124* 213* 192* 173* 167*   Lipid Profile: Recent Labs    11/19/17 0407  CHOL 142  HDL 34*  LDLCALC 53  TRIG [id number]*  CHOLHDL 4.2   Thyroid Function Tests: No results for input(s): TSH, T4TOTAL, FREET4, T3FREE, THYROIDAB in the last 72 hours. Anemia Panel: No results for input(s): VITAMINB12, FOLATE, FERRITIN, TIBC, IRON, RETICCTPCT in the last 72 hours. Sepsis Labs: No results for input(s): PROCALCITON, LATICACIDVEN in the last 168 hours.  No results found for this or any previous visit (from the past 240 hour(s)).       Radiology Studies: Mr Maxine GlennMra Neck W Wo Contrast  Result Date: 11/18/2017 CLINICAL DATA:  Stroke EXAM: MRA NECK WITHOUT AND WITH CONTRAST TECHNIQUE: Multiplanar and multiecho pulse sequences of the neck were obtained without and with  intravenous contrast. Angiographic images of the neck were obtained using MRA technique without and with intravenous contrast. CONTRAST:  17mL MULTIHANCE GADOBENATE DIMEGLUMINE 529 MG/ML IV SOLN COMPARISON:  MRI/MRA head 11/18/2017 FINDINGS: Antegrade flow in both carotid and vertebral arteries. Carotid bifurcation is normal bilaterally without stenosis or irregularity. Internal carotid artery normal bilaterally. Both vertebral arteries are patent to the basilar. There is a moderate stenosis of the distal left vertebral artery as noted on prior MRA head IMPRESSION: Negative for carotid stenosis Moderate stenosis distal left vertebral artery. Electronically Signed   By: Marlan Palauharles  Clark M.D.   On: 11/18/2017 12:00   Mr Brain Wo Contrast (neuro Protocol)  Result Date: 11/18/2017 CLINICAL DATA:  Left eye sixth nerve palsy. Right-sided weakness. Diplopia. Focal neuro deficit, greater than 6 hours, stroke suspected. EXAM: MRI HEAD WITHOUT CONTRAST TECHNIQUE: Multiplanar, multiecho pulse sequences of the brain and surrounding structures were obtained  without intravenous contrast. COMPARISON:  CT head without contrast 09/09/2012. MRI brain 03/24/2007. FINDINGS: Brain: Acute left paramedian inferior pontine infarct likely impacts the left sixth nerve nucleus or white matter tract. There is no associated hemorrhage or mass lesion. No supratentorial infarcts are present. T2 signal changes are associated with the area of acute infarction. Mild periventricular and subcortical white matter changes are noted bilaterally. The ventricles are of normal size. No significant extra-axial fluid collection is present. Remote lacunar infarcts are present in the cerebellum bilaterally. Vascular: Flow is present in the major intracranial arteries. Skull and upper cervical spine: The skull base is within normal limits. The craniocervical junction is normal. The visualized intracranial contents crash that Sinuses/Orbits: The paranasal  sinuses and mastoid air cells are clear. Globes and orbits are within normal limits. IMPRESSION: 1. Acute/subacute left paramedian inferior pontine infarct likely impacts the left sixth nerve nucleus or nerve. This corresponds with the patient's M2 since palsy and diplopia. 2. No other acute infarct. 3. Remote lacunar infarcts of the cerebellum bilaterally. 4. Periventricular and subcortical white matter changes are mildly advanced for age. This likely reflects the sequela of chronic microvascular ischemia. These results were called by telephone at the time of interpretation on 11/18/2017 at 9:48 am to Dr. Samuel Jester , who verbally acknowledged these results. Electronically Signed   By: Marin Roberts M.D.   On: 11/18/2017 09:51   Mr Maxine Glenn Head (cerebral Arteries)  Result Date: 11/18/2017 CLINICAL DATA:  Diplopia.  Focal neuro deficit. EXAM: MRA HEAD WITHOUT CONTRAST TECHNIQUE: Angiographic images of the Circle of Willis were obtained using MRA technique without intravenous contrast. COMPARISON:  CT head without contrast/17/14 FINDINGS: The right internal carotid artery is tortuous in the cervical ICA without a significant stenosis. Atherosclerotic changes are present within the cavernous internal carotid arteries bilaterally without a significant stenosis relative to the more distal ICA terminus. The A1 and M1 segments are normal. The anterior communicating artery is patent. MCA bifurcations are intact. There is some attenuation of ACA branch vessels bilaterally. The vertebral arteries are within normal limits bilaterally. PICA origins are visualized and normal. There is a moderate high-grade distal left vertebral artery stenosis. A mild to moderate right vertebral artery stenosis is present. Both of these are at the vertebrobasilar junction. The basilar artery is normal. Moderate P2 segment stenoses are present bilaterally. IMPRESSION: 1. Moderate to severe distal left vertebral artery stenosis at the  vertebrobasilar junction. 2. Mild to moderate distal right vertebral artery stenosis at the vertebrobasilar junction. 3. Moderate P2 segment stenoses within the PCA vessels bilaterally. 4. Atherosclerotic changes within the internal carotid arteries without significant stenosis proximally in the anterior circulation. 5. Distal small vessel disease within the ACA and MCA branch vessels. Electronically Signed   By: Marin Roberts M.D.   On: 11/18/2017 09:45        Scheduled Meds: . aspirin  325 mg Oral Daily  . atorvastatin  80 mg Oral q1800  . clopidogrel  75 mg Oral Q breakfast  . enoxaparin (LOVENOX) injection  40 mg Subcutaneous Q24H  . lisinopril  20 mg Oral Daily   And  . hydrochlorothiazide  25 mg Oral Daily  . insulin aspart  0-15 Units Subcutaneous TID WC  . insulin aspart  0-5 Units Subcutaneous QHS  . insulin aspart  4 Units Subcutaneous TID WC  . insulin glargine  10 Units Subcutaneous QHS  . metoprolol tartrate  50 mg Oral BID   Continuous Infusions: . sodium chloride 75  mL/hr at 11/19/17 0529     LOS: 1 day       Alwyn Ren, MD [facility]  If 7PM-7AM, please contact night-coverage [email or web address] Password [facility] 11/19/2017, 12:57 PM

## 2017-11-19 NOTE — Progress Notes (Signed)
Echocardiogram 2D Echocardiogram has been performed.  Matthew ChimesWendy  Jafar Walker 11/19/2017, 1:21 PM

## 2017-11-19 NOTE — Evaluation (Signed)
Physical Therapy Evaluation Patient Details Name: Matthew Walker MRN: [id number] DOB: 10-Jan-1966 Today's Date: 11/19/2017   History of Present Illness  Matthew Walker is a 52 y.o. male with history significant for hypertension, hyperlipidemia, type 2 diabetes, history of ST elevated MI earlier this year with PCI/DES x1 to LAD. Admitted with Blurry/double vision. MRI:   Left pontine CVA  Clinical Impression  Orders received for PT evaluation. Patient demonstrates deficits in functional mobility as indicated below. Will benefit from continued skilled PT to address deficits and maximize function. Will see as indicated and progress as tolerated.  Anticipate patient will be safe for d/c home, but will benefit from outpatient follow up services.    Follow Up Recommendations Outpatient PT    Equipment Recommendations  None recommended by PT    Recommendations for Other Services       Precautions / Restrictions Precautions Precautions: Fall Precaution Comments: due to vision Restrictions Weight Bearing Restrictions: No      Mobility  Bed Mobility Overal bed mobility: Independent                Transfers Overall transfer level: Needs assistance Equipment used: None             General transfer comment: Supervision for safety due to modest instability  Ambulation/Gait Ambulation/Gait assistance: Supervision;Min guard Gait Distance (Feet): 310 Feet Assistive device: None Gait Pattern/deviations: Step-through pattern;Decreased stride length;Drifts right/left;Staggering right Gait velocity: decreased   General Gait Details: patient with noted instability during higher level ambulatory tasks, multiple LOb but able to self correct without physical assist  Stairs Stairs: Yes Stairs assistance: Supervision Stair Management: No rails Number of Stairs: 4 General stair comments: supervision for safety  Wheelchair Mobility    Modified Rankin (Stroke Patients Only) Modified  Rankin (Stroke Patients Only) Pre-Morbid Rankin Score: No symptoms Modified Rankin: Moderate disability     Balance Overall balance assessment: Mild deficits observed, not formally tested(PT seeing pt)                               Standardized Balance Assessment Standardized Balance Assessment : Dynamic Gait Index   Dynamic Gait Index Level Surface: Normal Change in Gait Speed: Normal Gait with Horizontal Head Turns: Mild Impairment Gait with Vertical Head Turns: Mild Impairment Gait and Pivot Turn: Mild Impairment Step Over Obstacle: Normal Step Around Obstacles: Mild Impairment Steps: Mild Impairment Total Score: 19       Pertinent Vitals/Pain Pain Assessment: No/denies pain    Home Living Family/patient expects to be discharged to:: Private residence Living Arrangements: Spouse/significant other Available Help at Discharge: Family;Available PRN/intermittently(wife works, son at home has Down's Syndrome) Type of Home: House Home Access: Stairs to enter Entrance Stairs-Rails: None Secretary/administratorntrance Stairs-Number of Steps: 1 Home Layout: One level Home Equipment: None      Prior Function Level of Independence: Independent         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     Hand Dominance   Dominant Hand: Right    Extremity/Trunk Assessment   Upper Extremity Assessment Upper Extremity Assessment: Overall WFL for tasks assessed    Lower Extremity Assessment Lower Extremity Assessment: RLE deficits/detail RLE Deficits / Details: modest assymetrical weakness gross motions 4+/5 RLE Sensation: decreased light touch RLE Coordination: decreased fine motor;decreased gross motor       Communication   Communication: No difficulties  Cognition Arousal/Alertness: Awake/alert Behavior During Therapy: WFL for  tasks assessed/performed Overall Cognitive Status: Within Functional Limits for tasks assessed                                         General Comments      Exercises Other Exercises Other Exercises: Educated pt on taping of glasses and vision exercises with right eye covered to track horzonitally and horzontally/diagonally with left eye   Assessment/Plan    PT Assessment Patient needs continued PT services  PT Problem List Decreased strength;Decreased activity tolerance;Decreased balance;Decreased mobility;Decreased coordination       PT Treatment Interventions DME instruction;Gait training;Stair training;Functional mobility training;Therapeutic activities;Therapeutic exercise;Balance training;Patient/family education    PT Goals (Current goals can be found in the Care Plan section)  Acute Rehab PT Goals Patient Stated Goal: to get back to work PT Goal Formulation: With patient Time For Goal Achievement: 12/03/17 Potential to Achieve Goals: Good    Frequency Min 3X/week   Barriers to discharge        Co-evaluation               AM-PAC PT "6 Clicks" Daily Activity  Outcome Measure Difficulty turning over in bed (including adjusting bedclothes, sheets and blankets)?: None Difficulty moving from lying on back to sitting on the side of the bed? : None Difficulty sitting down on and standing up from a chair with arms (e.g., wheelchair, bedside commode, etc,.)?: A Little Help needed moving to and from a bed to chair (including a wheelchair)?: None Help needed walking in hospital room?: A Little Help needed climbing 3-5 steps with a railing? : A Little 6 Click Score: 21    End of Session Equipment Utilized During Treatment: Gait belt Activity Tolerance: Patient tolerated treatment well Patient left: in bed;with call bell/phone within reach Nurse Communication: Mobility status PT Visit Diagnosis: Unsteadiness on feet (R26.81)    Time: 1610-9604 PT Time Calculation (min) (ACUTE ONLY): 14 min   Charges:   PT Evaluation $PT Eval Moderate Complexity: 1 Mod          Charlotte Crumb, PT  DPT  Board Certified Neurologic Specialist [phone]   Fabio Asa 11/19/2017, 12:08 PM

## 2017-11-19 NOTE — Discharge Summary (Signed)
Physician Discharge Summary  QUINN QUAM ZOX:[id number] DOB: Aug 05, 1965 DOA: 11/18/2017  PCP: Estanislado Pandy, MD  Admit date: 11/18/2017 Discharge date: 11/19/2017  Admitted From: HOME Disposition:  HOME  Recommendations for Outpatient Follow-up:  1. Follow up with PCP in 1-2 weeks 2. Please obtain BMP/CBC in one week 3. FOLLOW UP WITH STROKE CLINIC IN 2 TO 4 WEEKS  Home HealthNONE Equipment/DeviceNONE  Discharge ConditionSTABLE CODE STATUSFULL Diet recommendation: MODIFIED CARB CARDIAC  Brief/Interim Summary: 52 y.o. male with history significant for hypertension, hyperlipidemia, type 2 diabetes, history of ST elevated MI earlier this year with PCI/DES x1 to LAD.  He was apparently enrolled in a statin clinical trial and is still undergoing follow-up for this.  Patient states that yesterday evening at around 630 to 7 PM as he pulled into his driveway at home he noticed significant blurry/double vision when looking to the left.  He thought there was a problem with his contacts and went to bed.  When he woke up this morning and open his eyes he had severe diplopia and dizziness.  He also complained of imbalance while walking but states that this only happened when his eyes were open, if he tried to walk with his eyes closed he did not have an issue.  He decided to come into the hospital for evaluation.  EDP proceeded immediately with an MRI of the brain that showed an acute/subacute left inferior pontine infarct and admission was requested.  Patient and patient's family are very insistent on patient being transferred to [facility]     Discharge Diagnoses:  Active Problems:   STEMI (ST elevation myocardial infarction) (HCC)   Type 2 diabetes mellitus with complication, without long-term current use of insulin (HCC)   Hypertension, essential   Hyperlipidemia LDL goal <70   Coronary artery disease involving native coronary artery of native heart with angina pectoris (HCC)   Tobacco use    Cerebrovascular accident [facility])  Acute/subacute left pontine CVA-patient was transferred [facility] for neurology work-up.  Patient was treated with aspirin and Plavix.  He was already taking aspirin and Plavix at home.  P2 Y 12 lab was ordered and the results were 55 indicating sufficient antiplatelet suppression with Plavix.  He was already on Lipitor 80 mg at home which was continued.  His stroke risk factors were smoking history of CAD hypertension hyperlipidemia.  Smoking cessation counseling was given.  Hemoglobin A1c was above 10.  Patient was only on metformin.  He reports he is follows up with his PCP on a regular basis the last hemoglobin A1c was 8 or 9.  He was seen by PT OT and speech therapy.  They recommended home physical therapy.  Patient continued to have diplopia.  Echocardiogram shows normal ejection fraction no wall motion abnormalities no thrombus.  Patient did have bilateral vertebral artery stenosis which was not amenable to stenting without high risk.  Patient to follow-up with stroke clinic in 2 to 4 weeks. -Hypertension -Restart lisinopril hydrochlorothiazide and metoprolol tartrate. Hyperlipidemia -Continue statin.  Type 2 diabetes hemoglobin A1c above 10 continue metformin add glipizide 5 mg twice daily I have told him to check his blood sugar 3 times a day at least and write it down and take it to his PCP to adjust his dose and follow-up hemoglobin A1c in 3 months and adjust the dose as needed or start insulin.  Patient was more in favor of oral drugs and starting insulin.  Discharge Instructions  Discharge Instructions    Call MD for:  difficulty breathing, headache or visual disturbances   Complete by:  As directed    Call MD for:  persistant dizziness or light-headedness   Complete by:  As directed    Call MD for:  persistant nausea and vomiting   Complete by:  As directed    Call MD for:  severe uncontrolled pain   Complete by:  As  directed    Call MD for:  temperature >100.4   Complete by:  As directed    Diet - low sodium heart healthy   Complete by:  As directed    Increase activity slowly   Complete by:  As directed      Allergies as of 11/19/2017      Reactions   Bee Venom Other (See Comments)   Hay Fever      Medication List    STOP taking these medications   aspirin 81 MG chewable tablet Replaced by:  aspirin 325 MG tablet   metoprolol tartrate 50 MG tablet Commonly known as:  LOPRESSOR   nitroGLYCERIN 0.4 MG SL tablet Commonly known as:  NITROSTAT   TRAVEL SICKNESS 25 MG Chew Generic drug:  Meclizine HCl     TAKE these medications   aspirin 325 MG tablet Take 1 tablet (325 mg total) by mouth daily. Start taking on:  11/20/2017 Replaces:  aspirin 81 MG chewable tablet   atorvastatin 80 MG tablet Commonly known as:  LIPITOR TAKE ONE TABLET BY MOUTH DAILY AT 6 PM.   clopidogrel 75 MG tablet Commonly known as:  PLAVIX Take 1 tablet (75 mg total) by mouth daily with breakfast.   EMERGEN-C IMMUNE PO Take 1 packet by mouth daily.   fexofenadine 60 MG tablet Commonly known as:  ALLEGRA Take 60 mg by mouth daily as needed for allergies or rhinitis.   fluticasone 50 MCG/ACT nasal spray Commonly known as:  FLONASE Place 1 spray into both nostrils as needed for congestion.   glipiZIDE 5 MG tablet Commonly known as:  GLUCOTROL Take 1 tablet (5 mg total) by mouth 2 (two) times daily.   lisinopril-hydrochlorothiazide 20-25 MG tablet Commonly known as:  PRINZIDE,ZESTORETIC Take 1 tablet by mouth daily.   metFORMIN 500 MG 24 hr tablet Commonly known as:  GLUCOPHAGE-XR Take 500 mg by mouth 2 (two) times daily.   sodium chloride 0.65 % Soln nasal spray Commonly known as:  OCEAN Place 1 spray into both nostrils as needed for congestion.      Follow-up Information    Sasser, Clarene Critchley, MD Follow up.   Specialty:  Family Medicine Contact information: [address] [place]  [address] [phone]        Lyn Records, MD .   Specialty:  Cardiology Contact information: [phone] [address] [place] [address] [phone]        Micki Riley, MD Follow up.   Specialties:  Neurology, Radiology Contact information: [address] [place] [address] [phone]          Allergies  Allergen Reactions  . Bee Venom Other (See Comments)    Hay Fever    Consultations: Neurology  Procedures/Studies: Mr Maxine Glenn Neck W Wo Contrast  Result Date: 11/18/2017 CLINICAL DATA:  Stroke EXAM: MRA NECK WITHOUT AND WITH CONTRAST TECHNIQUE: Multiplanar and multiecho pulse sequences of the neck were obtained without and with intravenous contrast. Angiographic images of the neck were obtained  using MRA technique without and with intravenous contrast. CONTRAST:  17mL MULTIHANCE GADOBENATE DIMEGLUMINE 529 MG/ML IV SOLN COMPARISON:  MRI/MRA head 11/18/2017 FINDINGS: Antegrade flow in both carotid and vertebral arteries. Carotid bifurcation is normal bilaterally without stenosis or irregularity. Internal carotid artery normal bilaterally. Both vertebral arteries are patent to the basilar. There is a moderate stenosis of the distal left vertebral artery as noted on prior MRA head IMPRESSION: Negative for carotid stenosis Moderate stenosis distal left vertebral artery. Electronically Signed   By: Marlan Palau M.D.   On: 11/18/2017 12:00   Mr Brain Wo Contrast (neuro Protocol)  Result Date: 11/18/2017 CLINICAL DATA:  Left eye sixth nerve palsy. Right-sided weakness. Diplopia. Focal neuro deficit, greater than 6 hours, stroke suspected. EXAM: MRI HEAD WITHOUT CONTRAST TECHNIQUE: Multiplanar, multiecho pulse sequences of the brain and surrounding structures were obtained without intravenous contrast. COMPARISON:  CT head without contrast 09/09/2012. MRI brain 03/24/2007. FINDINGS: Brain: Acute left paramedian inferior pontine infarct likely impacts the  left sixth nerve nucleus or white matter tract. There is no associated hemorrhage or mass lesion. No supratentorial infarcts are present. T2 signal changes are associated with the area of acute infarction. Mild periventricular and subcortical white matter changes are noted bilaterally. The ventricles are of normal size. No significant extra-axial fluid collection is present. Remote lacunar infarcts are present in the cerebellum bilaterally. Vascular: Flow is present in the major intracranial arteries. Skull and upper cervical spine: The skull base is within normal limits. The craniocervical junction is normal. The visualized intracranial contents crash that Sinuses/Orbits: The paranasal sinuses and mastoid air cells are clear. Globes and orbits are within normal limits. IMPRESSION: 1. Acute/subacute left paramedian inferior pontine infarct likely impacts the left sixth nerve nucleus or nerve. This corresponds with the patient's M2 since palsy and diplopia. 2. No other acute infarct. 3. Remote lacunar infarcts of the cerebellum bilaterally. 4. Periventricular and subcortical white matter changes are mildly advanced for age. This likely reflects the sequela of chronic microvascular ischemia. These results were called by telephone at the time of interpretation on 11/18/2017 at 9:48 am to Dr. Samuel Jester , who verbally acknowledged these results. Electronically Signed   By: Marin Roberts M.D.   On: 11/18/2017 09:51   Mr Maxine Glenn Head (cerebral Arteries)  Result Date: 11/18/2017 CLINICAL DATA:  Diplopia.  Focal neuro deficit. EXAM: MRA HEAD WITHOUT CONTRAST TECHNIQUE: Angiographic images of the Circle of Willis were obtained using MRA technique without intravenous contrast. COMPARISON:  CT head without contrast/17/14 FINDINGS: The right internal carotid artery is tortuous in the cervical ICA without a significant stenosis. Atherosclerotic changes are present within the cavernous internal carotid arteries  bilaterally without a significant stenosis relative to the more distal ICA terminus. The A1 and M1 segments are normal. The anterior communicating artery is patent. MCA bifurcations are intact. There is some attenuation of ACA branch vessels bilaterally. The vertebral arteries are within normal limits bilaterally. PICA origins are visualized and normal. There is a moderate high-grade distal left vertebral artery stenosis. A mild to moderate right vertebral artery stenosis is present. Both of these are at the vertebrobasilar junction. The basilar artery is normal. Moderate P2 segment stenoses are present bilaterally. IMPRESSION: 1. Moderate to severe distal left vertebral artery stenosis at the vertebrobasilar junction. 2. Mild to moderate distal right vertebral artery stenosis at the vertebrobasilar junction. 3. Moderate P2 segment stenoses within the PCA vessels bilaterally. 4. Atherosclerotic changes within the internal carotid arteries without significant stenosis proximally in  the anterior circulation. 5. Distal small vessel disease within the ACA and MCA branch vessels. Electronically Signed   By: Marin Robertshristopher  Mattern M.D.   On: 11/18/2017 09:45    (Echo, Carotid, EGD, Colonoscopy, ERCP)    Subjective:   Discharge Exam: Vitals:   11/19/17 0948 11/19/17 1247  BP: (!) 181/92 (!) 166/102  Pulse: (!) 51 (!) 54  Resp: 20 20  Temp: 98.6 F (37 C) 98.6 F (37 C)  SpO2: 97% 97%   Vitals:   11/18/17 2335 11/19/17 0345 11/19/17 0948 11/19/17 1247  BP: 137/84 (!) 168/103 (!) 181/92 (!) 166/102  Pulse: (!) 49 (!) 47 (!) 51 (!) 54  Resp: 13 14 20 20   Temp: 97.6 F (36.4 C) 97.9 F (36.6 C) 98.6 F (37 C) 98.6 F (37 C)  TempSrc: Oral Oral Oral Oral  SpO2: 95% 97% 97% 97%  Weight:      Height:        General: Pt is alert, awake, not in acute distress Cardiovascular: RRR, S1/S2 +, no rubs, no gallops Respiratory: CTA bilaterally, no wheezing, no rhonchi Abdominal: Soft, NT, ND, bowel  sounds + Extremities: no edema, no cyanosis    The results of significant diagnostics from this hospitalization (including imaging, microbiology, ancillary and laboratory) are listed below for reference.     Microbiology: No results found for this or any previous visit (from the past 240 hour(s)).   Labs: BNP (last 3 results) No results for input(s): BNP in the last 8760 hours. Basic Metabolic Panel: Recent Labs  Lab 11/18/17 0844 11/18/17 1558  NA 136  --   K 3.6  --   CL 99  --   CO2 26  --   GLUCOSE 283*  --   BUN 15  --   CREATININE 0.81 0.85  CALCIUM 9.8  --    Liver Function Tests: Recent Labs  Lab 11/18/17 0844  AST 16  ALT 24  ALKPHOS 75  BILITOT 1.4*  PROT 7.7  ALBUMIN 4.3   No results for input(s): LIPASE, AMYLASE in the last 168 hours. No results for input(s): AMMONIA in the last 168 hours. CBC: Recent Labs  Lab 11/18/17 0844 11/18/17 1558  WBC 8.5 8.9  NEUTROABS 5.7  --   HGB 18.3* 16.6  HCT 52.0 49.2  MCV 86.7 86.8  PLT 119* 132*   Cardiac Enzymes: Recent Labs  Lab 11/18/17 0844  TROPONINI <0.03   BNP: Invalid input(s): POCBNP CBG: Recent Labs  Lab 11/18/17 1638 11/18/17 1832 11/18/17 2141 11/19/17 0617 11/19/17 1142  GLUCAP 124* 213* 192* 173* 167*   D-Dimer No results for input(s): DDIMER in the last 72 hours. Hgb A1c Recent Labs    11/18/17 1558 11/19/17 0407  HGBA1C 10.3* 10.2*   Lipid Profile Recent Labs    11/19/17 0407  CHOL 142  HDL 34*  LDLCALC 53  TRIG 161273*  CHOLHDL 4.2   Thyroid function studies No results for input(s): TSH, T4TOTAL, T3FREE, THYROIDAB in the last 72 hours.  Invalid input(s): FREET3 Anemia work up No results for input(s): VITAMINB12, FOLATE, FERRITIN, TIBC, IRON, RETICCTPCT in the last 72 hours. Urinalysis    Component Value Date/Time   COLORURINE STRAW (A) 11/18/2017 0807   APPEARANCEUR CLEAR 11/18/2017 0807   LABSPEC 1.006 11/18/2017 0807   PHURINE 6.0 11/18/2017 0807    GLUCOSEU >=500 (A) 11/18/2017 0807   HGBUR NEGATIVE 11/18/2017 0807   BILIRUBINUR NEGATIVE 11/18/2017 0807   KETONESUR NEGATIVE 11/18/2017 0807   PROTEINUR  NEGATIVE 11/18/2017 0807   NITRITE NEGATIVE 11/18/2017 0807   LEUKOCYTESUR NEGATIVE 11/18/2017 0807   Sepsis Labs Invalid input(s): PROCALCITONIN,  WBC,  LACTICIDVEN Microbiology No results found for this or any previous visit (from the past 240 hour(s)).   Time coordinating discharge: 35 minutes  SIGNED:   Alwyn Ren, MD  [facility] 11/19/2017, 3:22 PM Pager   If 7PM-7AM, please contact night-coverage [email or web address] Password [email or web address]

## 2017-11-19 NOTE — Progress Notes (Signed)
Nurse went over discharge with Patient and family. Patient and family verbalized understanding. All questions and concerns addressed. dischaging home with all belongings. Taking down in a wheel chair.

## 2017-11-19 NOTE — Evaluation (Signed)
Occupational Therapy Evaluation Patient Details Name: Matthew Walker MRN: [id number] DOB: 09-30-1965 Today's Date: 11/19/2017    History of Present Illness Matthew SharperJon M Hadsall is a 52 y.o. male with history significant for hypertension, hyperlipidemia, type 2 diabetes, history of ST elevated MI earlier this year with PCI/DES x1 to LAD. Admitted with Blurry/double vision. MRI:   Left pontine CVA with left abducens nerve palsy.      Clinical Impression   This 52 yo male admitted and found to have above presents to acute OT with double and blurry vision with decreased lateral tracking of left eye. He will benefit from acute OT with follow up OPOT. Advised pt that he would need clearance from MD to drive.    Follow Up Recommendations  Outpatient OT    Equipment Recommendations  None recommended by OT       Precautions / Restrictions Precautions Precautions: Fall Precaution Comments: due to vision Restrictions Weight Bearing Restrictions: No      Mobility Bed Mobility Overal bed mobility: Independent                Transfers Overall transfer level: Needs assistance               General transfer comment: S due to mild balance deficits    Balance Overall balance assessment: Mild deficits observed, not formally tested(PT seeing pt)                                         ADL either performed or assessed with clinical judgement   ADL                                         General ADL Comments: Overall at a S level due to mild balance deficits and vision deficits. We discussed safety in kitchen and showering due to vision deficits.     Vision Baseline Vision/History: Wears glasses Wears Glasses: At all times(has glasses for far away and contacts one of each) Patient Visual Report: Diplopia;Blurring of vision Vision Assessment?: Yes Eye Alignment: Impaired (comment) Ocular Range of Motion: Restricted on the left Alignment/Gaze  Preference: Head turned;Gaze right(to abate double vision) Tracking/Visual Pursuits: Left eye does not track laterally(past midline except when he closes right eye, left eye will track minimally more laterally) Convergence: Within functional limits            Pertinent Vitals/Pain Pain Assessment: No/denies pain     Hand Dominance Right   Extremity/Trunk Assessment Upper Extremity Assessment Upper Extremity Assessment: Overall WFL for tasks assessed           Communication Communication Communication: No difficulties   Cognition Arousal/Alertness: Awake/alert Behavior During Therapy: WFL for tasks assessed/performed Overall Cognitive Status: Within Functional Limits for tasks assessed                                        Exercises Other Exercises Other Exercises: Educated pt on taping of glasses and vision exercises with right eye covered to track horzonitally and horzontally/diagonally with left eye        Home Living Family/patient expects to be discharged to:: Private residence Living Arrangements: Spouse/significant other Available Help at Discharge: Family;Available PRN/intermittently(wife  works, son at home has Down's Syndrome) Type of Home: House Home Access: Stairs to enter Secretary/administrator of Steps: 1 Entrance Stairs-Rails: None Home Layout: One level     Bathroom Shower/Tub: IT trainer: Standard     Home Equipment: None          Prior Functioning/Environment Level of Independence: Independent        Comments: works as a Fish farm manager for a [employer] Problem List: Impaired balance (sitting and/or standing);Impaired vision/perception      OT Treatment/Interventions: Visual/perceptual remediation/compensation;Patient/family education    OT Goals(Current goals can be found in the care plan section) Acute Rehab OT Goals Patient Stated Goal: to get back to work OT Goal Formulation:  With patient Time For Goal Achievement: 11/26/17 Potential to Achieve Goals: Good  OT Frequency: Min 2X/week              AM-PAC PT "6 Clicks" Daily Activity     Outcome Measure Help from another person eating meals?: None Help from another person taking care of personal grooming?: None Help from another person toileting, which includes using toliet, bedpan, or urinal?: None Help from another person bathing (including washing, rinsing, drying)?: None Help from another person to put on and taking off regular upper body clothing?: None Help from another person to put on and taking off regular lower body clothing?: None 6 Click Score: 24   End of Session    Activity Tolerance: Patient tolerated treatment well Patient left: in chair  OT Visit Diagnosis: Unsteadiness on feet (R26.81);Other (comment)(double vision)                Time: 1610-9604 OT Time Calculation (min): 29 min Charges:  OT General Charges $OT Visit: 1 Visit OT Evaluation $OT Eval Moderate Complexity: [address] [place] [phone] 11/19/2017

## 2017-11-24 DIAGNOSIS — H532 Diplopia: Secondary | ICD-10-CM | POA: Diagnosis not present

## 2017-11-25 ENCOUNTER — Encounter (HOSPITAL_COMMUNITY): Payer: Self-pay | Admitting: *Deleted

## 2017-11-25 ENCOUNTER — Emergency Department (HOSPITAL_COMMUNITY): Payer: Federal, State, Local not specified - PPO

## 2017-11-25 ENCOUNTER — Emergency Department (HOSPITAL_COMMUNITY)
Admission: EM | Admit: 2017-11-25 | Discharge: 2017-11-25 | Disposition: A | Discharge status: Home / Self Care | Payer: Federal, State, Local not specified - PPO | Attending: Emergency Medicine | Admitting: Emergency Medicine

## 2017-11-25 DIAGNOSIS — Z7902 Long term (current) use of antithrombotics/antiplatelets: Secondary | ICD-10-CM | POA: Insufficient documentation

## 2017-11-25 DIAGNOSIS — E119 Type 2 diabetes mellitus without complications: Secondary | ICD-10-CM | POA: Insufficient documentation

## 2017-11-25 DIAGNOSIS — Z7984 Long term (current) use of oral hypoglycemic drugs: Secondary | ICD-10-CM | POA: Insufficient documentation

## 2017-11-25 DIAGNOSIS — I2510 Atherosclerotic heart disease of native coronary artery without angina pectoris: Secondary | ICD-10-CM | POA: Insufficient documentation

## 2017-11-25 DIAGNOSIS — Z951 Presence of aortocoronary bypass graft: Secondary | ICD-10-CM | POA: Insufficient documentation

## 2017-11-25 DIAGNOSIS — I10 Essential (primary) hypertension: Secondary | ICD-10-CM | POA: Diagnosis not present

## 2017-11-25 DIAGNOSIS — Z7982 Long term (current) use of aspirin: Secondary | ICD-10-CM | POA: Diagnosis not present

## 2017-11-25 DIAGNOSIS — I252 Old myocardial infarction: Secondary | ICD-10-CM | POA: Insufficient documentation

## 2017-11-25 DIAGNOSIS — Z955 Presence of coronary angioplasty implant and graft: Secondary | ICD-10-CM | POA: Insufficient documentation

## 2017-11-25 DIAGNOSIS — R202 Paresthesia of skin: Secondary | ICD-10-CM | POA: Diagnosis not present

## 2017-11-25 DIAGNOSIS — Z87891 Personal history of nicotine dependence: Secondary | ICD-10-CM | POA: Diagnosis not present

## 2017-11-25 DIAGNOSIS — R200 Anesthesia of skin: Secondary | ICD-10-CM | POA: Diagnosis not present

## 2017-11-25 DIAGNOSIS — I6930 Unspecified sequelae of cerebral infarction: Secondary | ICD-10-CM | POA: Insufficient documentation

## 2017-11-25 DIAGNOSIS — I639 Cerebral infarction, unspecified: Secondary | ICD-10-CM | POA: Diagnosis not present

## 2017-11-25 DIAGNOSIS — R9431 Abnormal electrocardiogram [ECG] [EKG]: Secondary | ICD-10-CM | POA: Diagnosis not present

## 2017-11-25 DIAGNOSIS — I69398 Other sequelae of cerebral infarction: Secondary | ICD-10-CM | POA: Diagnosis not present

## 2017-11-25 LAB — DIFFERENTIAL
Abs Immature Granulocytes: 0 10*3/uL (ref 0.0–0.1)
Basophils Absolute: 0.1 10*3/uL (ref 0.0–0.1)
Basophils Relative: 1 %
Eosinophils Absolute: 0.1 10*3/uL (ref 0.0–0.7)
Eosinophils Relative: 1 %
Immature Granulocytes: 1 %
Lymphocytes Relative: 26 %
Lymphs Abs: 2.2 10*3/uL (ref 0.7–4.0)
Monocytes Absolute: 0.6 10*3/uL (ref 0.1–1.0)
Monocytes Relative: 7 %
Neutro Abs: 5.4 10*3/uL (ref 1.7–7.7)
Neutrophils Relative %: 64 %

## 2017-11-25 LAB — CBC
HCT: 53.5 % — ABNORMAL HIGH (ref 39.0–52.0)
Hemoglobin: 17.5 g/dL — ABNORMAL HIGH (ref 13.0–17.0)
MCH: 28.8 pg (ref 26.0–34.0)
MCHC: 32.7 g/dL (ref 30.0–36.0)
MCV: 88.1 fL (ref 78.0–100.0)
Platelets: 127 10*3/uL — ABNORMAL LOW (ref 150–400)
RBC: 6.07 MIL/uL — ABNORMAL HIGH (ref 4.22–5.81)
RDW: 13.3 % (ref 11.5–15.5)
WBC: 8.3 10*3/uL (ref 4.0–10.5)

## 2017-11-25 LAB — COMPREHENSIVE METABOLIC PANEL
ALT: 29 U/L (ref 0–44)
AST: 21 U/L (ref 15–41)
Albumin: 4.2 g/dL (ref 3.5–5.0)
Alkaline Phosphatase: 67 U/L (ref 38–126)
Anion gap: 13 (ref 5–15)
BUN: 15 mg/dL (ref 6–20)
CO2: 23 mmol/L (ref 22–32)
Calcium: 9.9 mg/dL (ref 8.9–10.3)
Chloride: 104 mmol/L (ref 98–111)
Creatinine, Ser: 0.98 mg/dL (ref 0.61–1.24)
GFR calc Af Amer: >60 mL/min (ref >60)
GFR calc non Af Amer: >60 mL/min (ref >60)
Glucose, Bld: 125 mg/dL — ABNORMAL HIGH (ref 70–99)
Potassium: 3.7 mmol/L (ref 3.5–5.1)
Sodium: 140 mmol/L (ref 135–145)
Total Bilirubin: 1.2 mg/dL (ref 0.3–1.2)
Total Protein: 6.6 g/dL (ref 6.5–8.1)

## 2017-11-25 LAB — PROTIME-INR
INR: 1.01
Prothrombin Time: 13.2 seconds (ref 11.4–15.2)

## 2017-11-25 LAB — I-STAT TROPONIN, ED: Troponin i, poc: 0 ng/mL (ref 0.00–0.08)

## 2017-11-25 LAB — I-STAT CHEM 8, ED
BUN: 17 mg/dL (ref 6–20)
Calcium, Ion: 1.2 mmol/L (ref 1.15–1.40)
Chloride: 103 mmol/L (ref 98–111)
Creatinine, Ser: 0.9 mg/dL (ref 0.61–1.24)
Glucose, Bld: 120 mg/dL — ABNORMAL HIGH (ref 70–99)
HCT: 52 % (ref 39.0–52.0)
Hemoglobin: 17.7 g/dL — ABNORMAL HIGH (ref 13.0–17.0)
Potassium: 3.7 mmol/L (ref 3.5–5.1)
Sodium: 139 mmol/L (ref 135–145)
TCO2: 24 mmol/L (ref 22–32)

## 2017-11-25 LAB — CBG MONITORING, ED: Glucose-Capillary: 95 mg/dL (ref 70–99)

## 2017-11-25 LAB — APTT: aPTT: 28 seconds (ref 24–36)

## 2017-11-25 NOTE — ED Provider Notes (Signed)
[facility]ENC[facility] Provider Note   CSN: [id number] Arrival date & time: 11/25/17  1033     History   Chief Complaint Chief Complaint  Patient presents with  . Numbness    HPI Matthew Walker is a 52 y.o. male with a past medical history of CAD, CVA diagnosed 11/18/2017, type 2 diabetes presents to ED for worsening weakness on the right side.  Family noticed that he was dragging his right foot and had worsening of his slurred speech after waking up this morning.  Patient reports compliance with any medications that he was given after his hospital stay for his initial CVA last week.  Denies any head injuries or falls.  He reports blurry vision in his left eye that has been present since the CVA diagnosed last week.  Denies any other vision changes.  Denies any fever, neck pain, chest pain, shortness of breath.  HPI  Past Medical History:  Diagnosis Date  . Arthritis    "knees, hands" (11/18/2017)  . Coronary artery disease   . GERD (gastroesophageal reflux disease)   . High cholesterol   . Hypertension   . STEMI (ST elevation myocardial infarction) (HCC) 07/30/2017   PCI/DES x1 to mLAD with staged intervention to the pRCA, and Lcx. Normal EF  . Stroke (HCC) 11/18/2017   Acute/subacute left pontine CVA/notes 11/18/2017  . Type II diabetes mellitus [facility](HCC)     Patient Active Problem List   Diagnosis Date Noted  . Cerebrovascular accident (HCC) 11/18/2017  . Tobacco use 08/02/2017  . Coronary artery disease involving native coronary artery of native heart with angina pectoris (HCC)   . STEMI (ST elevation myocardial infarction) (HCC) 07/30/2017  . Type 2 diabetes mellitus with complication, without long-term current use of insulin (HCC) 07/30/2017  . Hypertension, essential 07/30/2017  . Hyperlipidemia LDL goal <70 07/30/2017  . Acute ST elevation myocardial infarction (STEMI) involving left anterior descending (LAD) coronary artery (HCC) 07/30/2017     Past Surgical History:  Procedure Laterality Date  . CORONARY STENT INTERVENTION N/A 07/30/2017   Procedure: CORONARY STENT INTERVENTION;  Surgeon: Lyn RecordsSmith, Henry W, MD;  [facility];  Service: Cardiovascular;  Laterality: N/A;  . CORONARY STENT INTERVENTION N/A 08/01/2017   Procedure: CORONARY STENT INTERVENTION;  Surgeon: Marykay LexHarding, David W, MD;  [facility];  Service: Cardiovascular;  Laterality: N/A;  . CORONARY/GRAFT ACUTE MI REVASCULARIZATION N/A 07/30/2017   Procedure: Coronary/Graft Acute MI Revascularization;  Surgeon: Lyn RecordsSmith, Henry W, MD;  Location: [facility] INVASIVE CV LAB;  Service: Cardiovascular;  Laterality: N/A;  . INGUINAL HERNIA REPAIR Bilateral   . KNEE ARTHROSCOPY Right   . LEFT HEART CATH AND CORONARY ANGIOGRAPHY N/A 07/30/2017   Procedure: LEFT HEART CATH AND CORONARY ANGIOGRAPHY;  Surgeon: Lyn RecordsSmith, Henry W, MD;  Location: [facility] INVASIVE CV LAB;  Service: Cardiovascular;  Laterality: N/A;  . LEFT HEART CATH AND CORONARY ANGIOGRAPHY N/A 08/01/2017   Procedure: LEFT HEART CATH AND CORONARY ANGIOGRAPHY;  Surgeon: Marykay LexHarding, David W, MD;  [facility];  Service: Cardiovascular;  Laterality: N/A;        Home Medications    Prior to Admission medications   Medication Sig Start Date End Date Taking? Authorizing Provider  aspirin 325 MG tablet Take 1 tablet (325 mg total) by mouth daily. 11/20/17  Yes Alwyn RenMathews, Elizabeth G, MD  atorvastatin (LIPITOR) 80 MG tablet TAKE ONE TABLET BY MOUTH DAILY AT 6 PM. 11/08/17  Yes Bhagat, ChandlerBhavinkumar, PA  clopidogrel (PLAVIX) 75 MG tablet Take 1 tablet (75 mg total) by mouth daily with breakfast. 08/02/17  Yes Arty Baumgartner, NP  fexofenadine (ALLEGRA) 60 MG tablet Take 60 mg by mouth daily as needed for allergies or rhinitis.   Yes [provider]  fluticasone (FLONASE) 50 MCG/ACT nasal spray Place 1 spray into both nostrils as needed for congestion. 07/25/17  Yes [provider]  glipiZIDE  (GLUCOTROL) 5 MG tablet Take 1 tablet (5 mg total) by mouth 2 (two) times daily. 11/19/17 11/19/18 Yes Alwyn Ren, MD  lisinopril-hydrochlorothiazide (PRINZIDE,ZESTORETIC) 20-25 MG tablet Take 1 tablet by mouth daily. 08/30/17  Yes Arty Baumgartner, NP  metFORMIN (GLUCOPHAGE-XR) 500 MG 24 hr tablet Take 500 mg by mouth 2 (two) times daily.  07/18/17  Yes [provider]  Multiple Vitamins-Minerals (EMERGEN-C IMMUNE PO) Take 1 packet by mouth daily.   Yes [provider]  sodium chloride (OCEAN) 0.65 % SOLN nasal spray Place 1 spray into both nostrils as needed for congestion.   Yes [provider]    Family History History reviewed. No pertinent family history.  Social History Social History   Tobacco Use  . Smoking status: Former Smoker    Packs/day: 1.00    Years: 36.00    Pack years: 36.00    Types: Cigarettes    Last attempt to quit: 07/29/2017    Years since quitting: 0.3  . Smokeless tobacco: Former Neurosurgeon    Types: Chew  Substance Use Topics  . Alcohol use: Not Currently  . Drug use: Not Currently     Allergies   Bee venom   Review of Systems Review of Systems  Constitutional: Negative for appetite change, chills and fever.  HENT: Negative for ear pain, rhinorrhea, sneezing and sore throat.   Eyes: Negative for photophobia and visual disturbance.  Respiratory: Negative for cough, chest tightness, shortness of breath and wheezing.   Cardiovascular: Negative for chest pain and palpitations.  Gastrointestinal: Negative for abdominal pain, blood in stool, constipation, diarrhea, nausea and vomiting.  Genitourinary: Negative for dysuria, hematuria and urgency.  Musculoskeletal: Negative for myalgias.  Skin: Negative for rash.  Neurological: Positive for weakness and numbness. Negative for dizziness and light-headedness.     Physical Exam Updated Vital Signs BP (!) 149/85   Pulse 70   Temp 97.6 F (36.4 C) (Oral)   Resp 14   SpO2  98%   Physical Exam  Constitutional: He is oriented to person, place, and time. He appears well-developed and well-nourished. No distress.  HENT:  Head: Normocephalic and atraumatic.  Nose: Nose normal.  Eyes: Pupils are equal, round, and reactive to light. Conjunctivae and EOM are normal. Right eye exhibits no discharge. Left eye exhibits no discharge. No scleral icterus.  Neck: Normal range of motion. Neck supple.  Cardiovascular: Normal rate, regular rhythm, normal heart sounds and intact distal pulses. Exam reveals no gallop and no friction rub.  No murmur heard. Pulmonary/Chest: Effort normal and breath sounds normal. No respiratory distress.  Abdominal: Soft. Bowel sounds are normal. He exhibits no distension. There is no tenderness. There is no guarding.  Musculoskeletal: Normal range of motion. He exhibits no edema.  Neurological: He is alert and oriented to person, place, and time. No cranial nerve deficit or sensory deficit. He exhibits normal muscle tone. Coordination normal.  L eye does not track to lateral side. PERRL. Droop noted on R side of face. Strength 3/5 in RUE, RLE. Normal finger to nose coordination  bilaterally. Sensation intact to light and sharp touch on face, BUE, BLE.  Skin: Skin is warm and dry. No rash noted.  Psychiatric: He has a normal mood and affect.  Nursing note and vitals reviewed.    ED Treatments / Results  Labs (all labs ordered are listed, but only abnormal results are displayed) Labs Reviewed  CBC - Abnormal; Notable for the following components:      Result Value   RBC 6.07 (*)    Hemoglobin 17.5 (*)    HCT 53.5 (*)    Platelets 127 (*)    All other components within normal limits  COMPREHENSIVE METABOLIC PANEL - Abnormal; Notable for the following components:   Glucose, Bld 125 (*)    All other components within normal limits  I-STAT CHEM 8, ED - Abnormal; Notable for the following components:   Glucose, Bld 120 (*)    Hemoglobin 17.7  (*)    All other components within normal limits  PROTIME-INR  APTT  DIFFERENTIAL  I-STAT TROPONIN, ED  CBG MONITORING, ED    EKG EKG Interpretation  Date/Time:  Friday November 25 2017 10:38:43 EDT Ventricular Rate:  82 PR Interval:  152 QRS Duration: 100 QT Interval:  378 QTC Calculation: 441 R Axis:   -65 Text Interpretation:  Normal sinus rhythm Left axis deviation Inferior infarct , age undetermined Anteroseptal infarct , age undetermined Abnormal ECG Confirmed by Margarita Grizzle [phone]) on 11/25/2017 3:31:54 PM   Radiology Ct Head Wo Contrast  Result Date: 11/25/2017 CLINICAL DATA:  Focal neural deficit of greater than 6 hours suspected stroke, increased RIGHT side numbness, recent stroke with RIGHT-sided deficits but today felt like symptoms had worsened, history coronary artery disease post NSTEMI, type II diabetes mellitus, hypertension, former smoker EXAM: CT HEAD WITHOUT CONTRAST TECHNIQUE: Contiguous axial images were obtained from the base of the skull through the vertex without intravenous contrast. COMPARISON:  09/09/2012; correlation MR brain 11/18/2017 FINDINGS: Brain: Normal ventricular morphology. No midline shift or mass effect. Small focus of low attenuation at the inferior LEFT pons corresponding to site of infarct on prior MR. Otherwise normal appearance of brain parenchyma. No intracranial hemorrhage, mass lesion, or evidence of acute infarction. No extra-axial fluid collections. Vascular: Atherosclerotic calcification of internal carotid and vertebral arteries bilaterally at skull base Skull: Intact Sinuses/Orbits: Clear Other: N/A IMPRESSION: Small pontine infarct as noted on recent MRI of 11/18/2017. No new intracranial abnormalities. Electronically Signed   By: Ulyses Southward M.D.   On: 11/25/2017 12:08    Procedures Procedures (including critical care time)  Medications Ordered in ED Medications - No data to display   Initial Impression / Assessment and Plan / ED  Course  I have reviewed the triage vital signs and the nursing notes.  Pertinent labs & imaging results that were available during my care of the patient were reviewed by me and considered in my medical decision making (see chart for details).     52 year old male with a past medical history of CAD, CVA diagnosed on 11/18/2017, type 2 diabetes presents to ED for worsening weakness on the right side.  States that he had weakness on the right side after he suffered from a stroke last week.  Family noticed that today his right foot was dragging more than it has been and also having weakness on the right side.  He reports numbness but on physical exam there is no changes in sensation noted.  He does have decreased strength about 3/5 on the right  side.  Patient states that since the stroke last week, he has had blurry vision in the left eye and is unable to look to the left with the left eye. Labwork including CBC, CBG, CMP, PT INR unremarkable.  CT shows small pontine infarct unchanged from MRI from 7/26. Spoke to neurology who recommends MRI brain without. Will re-consult neurology after MRI returns. Marigene Ehlers, PA-C to follow up on imaging at shift change.  Portions of this note were generated with Scientist, clinical (histocompatibility and immunogenetics). Dictation errors may occur despite best attempts at proofreading.   Final Clinical Impressions(s) / ED Diagnoses   Final diagnoses:  None    ED Discharge Orders    None       Dietrich Pates, PA-C 11/25/17 1548    Margarita Grizzle, MD 11/26/17 [phone]

## 2017-11-25 NOTE — ED Notes (Signed)
Pt ambulated to bathroom with assistance.

## 2017-11-25 NOTE — ED Provider Notes (Signed)
52 year old male [facility] and follow up with neuro. Per her HPI:  "Matthew SharperJon M Walker is a 52 y.o. male with a past medical history of CAD, CVA diagnosed 11/18/2017, type 2 diabetes presents to ED for worsening weakness on the right side.  Family noticed that he was dragging his right foot and had worsening of his slurred speech after waking up this morning.  Patient reports compliance with any medications that he was given after his hospital stay for his initial CVA last week.  Denies any head injuries or falls.  He reports blurry vision in his left eye that has been present since the CVA diagnosed last week.  Denies any other vision changes.  Denies any fever, neck pain, chest pain, shortness of breath."  Physical Exam  BP (!) 145/103   Pulse 70   Temp 97.6 F (36.4 C) (Oral)   Resp (!) 22   SpO2 97%   Physical Exam  Speaks in complete, fluent sentences.  NAD.  ED Course/Procedures     Procedures  MDM   52 year old male with a history of CVA and CAD [facility] and reconsult with neurology.  MRI obtained and was discussed with Dr. Wilford CornerArora, neurology, who felt that the MRI was consistent with a progression of  previous CVA.  No acute CVA. Dr. Wilford CornerArora recommended the patient be on dual antiplatelet therapy and statin, which the patient is currently taking and has been compliant with at home.  He has a follow-up appointment with his primary care provider tomorrow and with the stroke clinic in the next few weeks.  Strict return precautions given.  The patient is hemodynamically stable and in no acute distress.  He is safe for discharge home with outpatient follow-up at this time.        Frederik PearMcDonald, Maley Venezia A, PA-C 11/25/17 2210    Melene PlanFloyd, Dan, DO 11/25/17 2343

## 2017-11-25 NOTE — Discharge Instructions (Signed)
Thank you for allowing me to care for you today in the Emergency Department.   Please keep your appointment with Dr. Neita CarpSasser tomorrow.  Make sure to discuss your blood pressure with him.

## 2017-11-25 NOTE — ED Triage Notes (Signed)
Pt in c/o increased right sided numbness, pt recently admitted for a stoke with right sided deficits and residual numbness, this morning when we woke up he felt like the numbness was worse

## 2017-12-02 DIAGNOSIS — I639 Cerebral infarction, unspecified: Secondary | ICD-10-CM | POA: Diagnosis not present

## 2017-12-02 DIAGNOSIS — I252 Old myocardial infarction: Secondary | ICD-10-CM | POA: Diagnosis not present

## 2017-12-02 DIAGNOSIS — R262 Difficulty in walking, not elsewhere classified: Secondary | ICD-10-CM | POA: Diagnosis not present

## 2017-12-02 DIAGNOSIS — I10 Essential (primary) hypertension: Secondary | ICD-10-CM | POA: Diagnosis not present

## 2017-12-02 DIAGNOSIS — R278 Other lack of coordination: Secondary | ICD-10-CM | POA: Diagnosis not present

## 2017-12-02 DIAGNOSIS — M6281 Muscle weakness (generalized): Secondary | ICD-10-CM | POA: Diagnosis not present

## 2017-12-02 DIAGNOSIS — E119 Type 2 diabetes mellitus without complications: Secondary | ICD-10-CM | POA: Diagnosis not present

## 2017-12-05 DIAGNOSIS — I252 Old myocardial infarction: Secondary | ICD-10-CM | POA: Diagnosis not present

## 2017-12-05 DIAGNOSIS — I10 Essential (primary) hypertension: Secondary | ICD-10-CM | POA: Diagnosis not present

## 2017-12-05 DIAGNOSIS — R278 Other lack of coordination: Secondary | ICD-10-CM | POA: Diagnosis not present

## 2017-12-05 DIAGNOSIS — I639 Cerebral infarction, unspecified: Secondary | ICD-10-CM | POA: Diagnosis not present

## 2017-12-05 DIAGNOSIS — E119 Type 2 diabetes mellitus without complications: Secondary | ICD-10-CM | POA: Diagnosis not present

## 2017-12-05 DIAGNOSIS — R262 Difficulty in walking, not elsewhere classified: Secondary | ICD-10-CM | POA: Diagnosis not present

## 2017-12-05 DIAGNOSIS — M6281 Muscle weakness (generalized): Secondary | ICD-10-CM | POA: Diagnosis not present

## 2017-12-06 DIAGNOSIS — H4920 Sixth [abducent] nerve palsy, unspecified eye: Secondary | ICD-10-CM | POA: Diagnosis not present

## 2017-12-06 DIAGNOSIS — H5000 Unspecified esotropia: Secondary | ICD-10-CM | POA: Diagnosis not present

## 2017-12-06 DIAGNOSIS — H532 Diplopia: Secondary | ICD-10-CM | POA: Diagnosis not present

## 2017-12-06 DIAGNOSIS — I639 Cerebral infarction, unspecified: Secondary | ICD-10-CM | POA: Diagnosis not present

## 2017-12-08 DIAGNOSIS — R278 Other lack of coordination: Secondary | ICD-10-CM | POA: Diagnosis not present

## 2017-12-08 DIAGNOSIS — M6281 Muscle weakness (generalized): Secondary | ICD-10-CM | POA: Diagnosis not present

## 2017-12-08 DIAGNOSIS — I639 Cerebral infarction, unspecified: Secondary | ICD-10-CM | POA: Diagnosis not present

## 2017-12-08 DIAGNOSIS — E119 Type 2 diabetes mellitus without complications: Secondary | ICD-10-CM | POA: Diagnosis not present

## 2017-12-08 DIAGNOSIS — I10 Essential (primary) hypertension: Secondary | ICD-10-CM | POA: Diagnosis not present

## 2017-12-08 DIAGNOSIS — R262 Difficulty in walking, not elsewhere classified: Secondary | ICD-10-CM | POA: Diagnosis not present

## 2017-12-08 DIAGNOSIS — I252 Old myocardial infarction: Secondary | ICD-10-CM | POA: Diagnosis not present

## 2017-12-09 ENCOUNTER — Ambulatory Visit: Payer: Federal, State, Local not specified - PPO | Admitting: Interventional Cardiology

## 2017-12-09 DIAGNOSIS — R0989 Other specified symptoms and signs involving the circulatory and respiratory systems: Secondary | ICD-10-CM

## 2017-12-13 ENCOUNTER — Encounter: Payer: Self-pay | Admitting: Interventional Cardiology

## 2017-12-13 DIAGNOSIS — I639 Cerebral infarction, unspecified: Secondary | ICD-10-CM | POA: Diagnosis not present

## 2017-12-13 DIAGNOSIS — R278 Other lack of coordination: Secondary | ICD-10-CM | POA: Diagnosis not present

## 2017-12-13 DIAGNOSIS — R262 Difficulty in walking, not elsewhere classified: Secondary | ICD-10-CM | POA: Diagnosis not present

## 2017-12-13 DIAGNOSIS — I10 Essential (primary) hypertension: Secondary | ICD-10-CM | POA: Diagnosis not present

## 2017-12-13 DIAGNOSIS — E119 Type 2 diabetes mellitus without complications: Secondary | ICD-10-CM | POA: Diagnosis not present

## 2017-12-13 DIAGNOSIS — I252 Old myocardial infarction: Secondary | ICD-10-CM | POA: Diagnosis not present

## 2017-12-13 DIAGNOSIS — M6281 Muscle weakness (generalized): Secondary | ICD-10-CM | POA: Diagnosis not present

## 2017-12-15 DIAGNOSIS — I10 Essential (primary) hypertension: Secondary | ICD-10-CM | POA: Diagnosis not present

## 2017-12-15 DIAGNOSIS — I252 Old myocardial infarction: Secondary | ICD-10-CM | POA: Diagnosis not present

## 2017-12-15 DIAGNOSIS — E119 Type 2 diabetes mellitus without complications: Secondary | ICD-10-CM | POA: Diagnosis not present

## 2017-12-15 DIAGNOSIS — R262 Difficulty in walking, not elsewhere classified: Secondary | ICD-10-CM | POA: Diagnosis not present

## 2017-12-15 DIAGNOSIS — M6281 Muscle weakness (generalized): Secondary | ICD-10-CM | POA: Diagnosis not present

## 2017-12-15 DIAGNOSIS — R278 Other lack of coordination: Secondary | ICD-10-CM | POA: Diagnosis not present

## 2017-12-15 DIAGNOSIS — I639 Cerebral infarction, unspecified: Secondary | ICD-10-CM | POA: Diagnosis not present

## 2017-12-15 NOTE — Progress Notes (Signed)
Late entry:   Labs were reviewed with Dr Rennis GoldenHilty before infusion #2.

## 2017-12-19 DIAGNOSIS — I639 Cerebral infarction, unspecified: Secondary | ICD-10-CM | POA: Diagnosis not present

## 2017-12-19 DIAGNOSIS — Z720 Tobacco use: Secondary | ICD-10-CM | POA: Diagnosis not present

## 2017-12-19 DIAGNOSIS — M6281 Muscle weakness (generalized): Secondary | ICD-10-CM | POA: Diagnosis not present

## 2017-12-19 DIAGNOSIS — R278 Other lack of coordination: Secondary | ICD-10-CM | POA: Diagnosis not present

## 2017-12-19 DIAGNOSIS — I10 Essential (primary) hypertension: Secondary | ICD-10-CM | POA: Diagnosis not present

## 2017-12-19 DIAGNOSIS — E1165 Type 2 diabetes mellitus with hyperglycemia: Secondary | ICD-10-CM | POA: Diagnosis not present

## 2017-12-19 DIAGNOSIS — I252 Old myocardial infarction: Secondary | ICD-10-CM | POA: Diagnosis not present

## 2017-12-19 DIAGNOSIS — R262 Difficulty in walking, not elsewhere classified: Secondary | ICD-10-CM | POA: Diagnosis not present

## 2017-12-19 DIAGNOSIS — E119 Type 2 diabetes mellitus without complications: Secondary | ICD-10-CM | POA: Diagnosis not present

## 2017-12-22 DIAGNOSIS — I639 Cerebral infarction, unspecified: Secondary | ICD-10-CM | POA: Diagnosis not present

## 2017-12-22 DIAGNOSIS — R278 Other lack of coordination: Secondary | ICD-10-CM | POA: Diagnosis not present

## 2017-12-22 DIAGNOSIS — R262 Difficulty in walking, not elsewhere classified: Secondary | ICD-10-CM | POA: Diagnosis not present

## 2017-12-22 DIAGNOSIS — I252 Old myocardial infarction: Secondary | ICD-10-CM | POA: Diagnosis not present

## 2017-12-22 DIAGNOSIS — M6281 Muscle weakness (generalized): Secondary | ICD-10-CM | POA: Diagnosis not present

## 2017-12-22 DIAGNOSIS — I10 Essential (primary) hypertension: Secondary | ICD-10-CM | POA: Diagnosis not present

## 2017-12-22 DIAGNOSIS — E119 Type 2 diabetes mellitus without complications: Secondary | ICD-10-CM | POA: Diagnosis not present

## 2017-12-27 DIAGNOSIS — R278 Other lack of coordination: Secondary | ICD-10-CM | POA: Diagnosis not present

## 2017-12-27 DIAGNOSIS — Z8673 Personal history of transient ischemic attack (TIA), and cerebral infarction without residual deficits: Secondary | ICD-10-CM | POA: Diagnosis not present

## 2017-12-27 DIAGNOSIS — R262 Difficulty in walking, not elsewhere classified: Secondary | ICD-10-CM | POA: Diagnosis not present

## 2017-12-27 DIAGNOSIS — M6281 Muscle weakness (generalized): Secondary | ICD-10-CM | POA: Diagnosis not present

## 2018-01-03 DIAGNOSIS — M6281 Muscle weakness (generalized): Secondary | ICD-10-CM | POA: Diagnosis not present

## 2018-01-03 DIAGNOSIS — R262 Difficulty in walking, not elsewhere classified: Secondary | ICD-10-CM | POA: Diagnosis not present

## 2018-01-03 DIAGNOSIS — R278 Other lack of coordination: Secondary | ICD-10-CM | POA: Diagnosis not present

## 2018-01-03 DIAGNOSIS — Z8673 Personal history of transient ischemic attack (TIA), and cerebral infarction without residual deficits: Secondary | ICD-10-CM | POA: Diagnosis not present

## 2018-01-05 DIAGNOSIS — R262 Difficulty in walking, not elsewhere classified: Secondary | ICD-10-CM | POA: Diagnosis not present

## 2018-01-05 DIAGNOSIS — M6281 Muscle weakness (generalized): Secondary | ICD-10-CM | POA: Diagnosis not present

## 2018-01-05 DIAGNOSIS — Z8673 Personal history of transient ischemic attack (TIA), and cerebral infarction without residual deficits: Secondary | ICD-10-CM | POA: Diagnosis not present

## 2018-01-05 DIAGNOSIS — R278 Other lack of coordination: Secondary | ICD-10-CM | POA: Diagnosis not present

## 2018-01-17 ENCOUNTER — Other Ambulatory Visit: Payer: Self-pay | Admitting: Cardiology

## 2018-01-26 ENCOUNTER — Telehealth: Payer: Self-pay | Admitting: *Deleted

## 2018-02-06 ENCOUNTER — Encounter: Payer: Federal, State, Local not specified - PPO | Admitting: *Deleted

## 2018-02-06 VITALS — BP 147/92 | HR 64 | Wt 185.4 lb

## 2018-02-06 DIAGNOSIS — Z006 Encounter for examination for normal comparison and control in clinical research program: Secondary | ICD-10-CM

## 2018-02-06 NOTE — Research (Addendum)
Visit [facility]  Pt doing ok, working to get back to full time. Trying to recover from the stroke. BP is still elevated, will get Dr Katrinka Blazing to see him in office. He has changed his diet to try to help with bp and over all feel better.  No blood drawn today.                                    "CONSENT"   YES     NO   Continuing further Investigational Product and study visits for follow-up? [x]  []   Continuing consent from future biomedical research [x]  []                                    "EVENTS"    YES     NO  AE   (IF YES SEE SOURCE) []  [x]   SAE  (IF YES SEE SOURCE) []  [x]   ENDPOINT   (IF YES SEE SOURCE) []  [x]   REVASCULARIZATION  (IF YES SEE SOURCE) []  [x]   AMPUTATION   (IF YES SEE SOURCE) []  [x]   TROPONIN'S  (IF YES SEE SOURCE) []  [x]   Lifestyle Adherence Assessment:    YES NO  Abstinence from smoking/remaining tobacco free  X  Cardiac Diet X   Routine physical activity and/or cardiac rehabilitation X      Current Outpatient Medications:  .  amLODipine (NORVASC) 10 MG tablet, Take 10 mg by mouth daily., Disp: , Rfl:  .  aspirin 325 MG tablet, Take 1 tablet (325 mg total) by mouth daily., Disp: , Rfl:  .  atorvastatin (LIPITOR) 80 MG tablet, TAKE ONE TABLET BY MOUTH DAILY AT 6 PM., Disp: 90 tablet, Rfl: 2 .  clopidogrel (PLAVIX) 75 MG tablet, Take 1 tablet (75 mg total) by mouth daily with breakfast., Disp: 90 tablet, Rfl: 2 .  glipiZIDE (GLUCOTROL) 5 MG tablet, Take 1 tablet (5 mg total) by mouth 2 (two) times daily., Disp: 60 tablet, Rfl: 11 .  lisinopril-hydrochlorothiazide (PRINZIDE,ZESTORETIC) 20-25 MG tablet, Take 1 tablet by mouth daily., Disp: 30 tablet, Rfl: 0 .  metFORMIN (GLUCOPHAGE-XR) 500 MG 24 hr tablet, Take 500 mg by mouth 2 (two) times daily. , Disp: , Rfl: 3 .  Multiple Vitamins-Minerals (EMERGEN-C IMMUNE PO), Take 1 packet by mouth daily., Disp: , Rfl:  .  fexofenadine (ALLEGRA) 60 MG tablet, Take 60 mg by mouth daily as needed for allergies or rhinitis., Disp: ,  Rfl:  .  fluticasone (FLONASE) 50 MCG/ACT nasal spray, Place 1 spray into both nostrils as needed for congestion., Disp: , Rfl: 12 .  metoprolol tartrate (LOPRESSOR) 50 MG tablet, Take 50 mg by mouth 2 (two) times daily., Disp: , Rfl: 12 .  sodium chloride (OCEAN) 0.65 % SOLN nasal spray, Place 1 spray into both nostrils as needed for congestion., Disp: , Rfl:

## 2018-03-13 NOTE — Telephone Encounter (Signed)
Saw pt in clinic.

## 2018-03-28 DIAGNOSIS — I10 Essential (primary) hypertension: Secondary | ICD-10-CM | POA: Diagnosis not present

## 2018-03-28 DIAGNOSIS — E7801 Familial hypercholesterolemia: Secondary | ICD-10-CM | POA: Diagnosis not present

## 2018-03-28 DIAGNOSIS — E1165 Type 2 diabetes mellitus with hyperglycemia: Secondary | ICD-10-CM | POA: Diagnosis not present

## 2018-03-28 DIAGNOSIS — E782 Mixed hyperlipidemia: Secondary | ICD-10-CM | POA: Diagnosis not present

## 2018-03-30 DIAGNOSIS — I639 Cerebral infarction, unspecified: Secondary | ICD-10-CM | POA: Diagnosis not present

## 2018-03-30 DIAGNOSIS — E1165 Type 2 diabetes mellitus with hyperglycemia: Secondary | ICD-10-CM | POA: Diagnosis not present

## 2018-03-30 DIAGNOSIS — I214 Non-ST elevation (NSTEMI) myocardial infarction: Secondary | ICD-10-CM | POA: Diagnosis not present

## 2018-03-30 DIAGNOSIS — M7551 Bursitis of right shoulder: Secondary | ICD-10-CM | POA: Diagnosis not present

## 2018-03-30 DIAGNOSIS — I10 Essential (primary) hypertension: Secondary | ICD-10-CM | POA: Diagnosis not present

## 2018-03-30 DIAGNOSIS — Z23 Encounter for immunization: Secondary | ICD-10-CM | POA: Diagnosis not present

## 2018-04-20 ENCOUNTER — Other Ambulatory Visit (HOSPITAL_COMMUNITY): Payer: Self-pay | Admitting: Cardiology

## 2018-04-20 ENCOUNTER — Other Ambulatory Visit: Payer: Self-pay | Admitting: Physician Assistant

## 2018-04-28 DIAGNOSIS — M7551 Bursitis of right shoulder: Secondary | ICD-10-CM | POA: Diagnosis not present

## 2018-05-05 ENCOUNTER — Encounter: Payer: Self-pay | Admitting: *Deleted

## 2018-05-05 DIAGNOSIS — Z006 Encounter for examination for normal comparison and control in clinical research program: Secondary | ICD-10-CM

## 2018-05-16 DIAGNOSIS — H35033 Hypertensive retinopathy, bilateral: Secondary | ICD-10-CM | POA: Diagnosis not present

## 2018-05-19 NOTE — Research (Signed)
Late entry: Pt doing well, no complaints of cp or sob.  He said he added Norvasc to his med list on his own, and his BP is better.                                    "CONSENT"   YES     NO   Continuing further Investigational Product and study visits for follow-up? [x]  []   Continuing consent from future biomedical research [x]  []                                    "EVENTS"    YES     NO  AE   (IF YES SEE SOURCE) []  [x]   SAE  (IF YES SEE SOURCE) []  [x]   ENDPOINT   (IF YES SEE SOURCE) []  [x]   REVASCULARIZATION  (IF YES SEE SOURCE) []  [x]   AMPUTATION   (IF YES SEE SOURCE) []  [x]   TROPONIN'S  (IF YES SEE SOURCE) []  [x]      Current Outpatient Medications:  .  amLODipine (NORVASC) 10 MG tablet, Take 10 mg by mouth daily., Disp: , Rfl:  .  aspirin 325 MG tablet, Take 1 tablet (325 mg total) by mouth daily., Disp: , Rfl:  .  atorvastatin (LIPITOR) 80 MG tablet, TAKE ONE TABLET BY MOUTH DAILY AT 6 PM., Disp: 90 tablet, Rfl: 2 .  clopidogrel (PLAVIX) 75 MG tablet, TAKE ONE TABLET BY MOUTH DAILY WITH BREAKFAST., Disp: 90 tablet, Rfl: 0 .  fexofenadine (ALLEGRA) 60 MG tablet, Take 60 mg by mouth daily as needed for allergies or rhinitis., Disp: , Rfl:  .  fluticasone (FLONASE) 50 MCG/ACT nasal spray, Place 1 spray into both nostrils as needed for congestion., Disp: , Rfl: 12 .  glipiZIDE (GLUCOTROL) 5 MG tablet, Take 1 tablet (5 mg total) by mouth 2 (two) times daily., Disp: 60 tablet, Rfl: 11 .  lisinopril-hydrochlorothiazide (PRINZIDE,ZESTORETIC) 20-25 MG tablet, Take 1 tablet by mouth daily., Disp: 30 tablet, Rfl: 0 .  metFORMIN (GLUCOPHAGE-XR) 500 MG 24 hr tablet, Take 500 mg by mouth 2 (two) times daily. , Disp: , Rfl: 3 .  metoprolol tartrate (LOPRESSOR) 50 MG tablet, Take 50 mg by mouth 2 (two) times daily., Disp: , Rfl: 12 .  Multiple Vitamins-Minerals (EMERGEN-C IMMUNE PO), Take 1 packet by mouth daily., Disp: , Rfl:  .  sodium chloride (OCEAN) 0.65 % SOLN nasal spray, Place 1 spray into  both nostrils as needed for congestion., Disp: , Rfl:

## 2018-06-05 DIAGNOSIS — H4922 Sixth [abducent] nerve palsy, left eye: Secondary | ICD-10-CM | POA: Diagnosis not present

## 2018-06-14 DIAGNOSIS — H4922 Sixth [abducent] nerve palsy, left eye: Secondary | ICD-10-CM | POA: Diagnosis not present

## 2018-06-15 ENCOUNTER — Telehealth: Payer: Self-pay | Admitting: *Deleted

## 2018-06-15 NOTE — Telephone Encounter (Signed)
   Primary Cardiologist:Henry Malissa Hippo III, MD  Chart reviewed as part of pre-operative protocol coverage. Because of Matthew Walker past medical history and time since last visit, he/she will require a follow-up visit in order to better assess preoperative cardiovascular risk.   He had a STEMI 07/2017 and had PCI. Per clearance request, pt will not need to hold blood thinners (see below). However given cardiac history, planned general anesthesia and >6 months since last OV, he will need an appt for evaluation prior to clearance.   Pre-op covering staff: - Please schedule appointment and call patient to inform them. - Please contact requesting surgeon's office via preferred method (i.e, phone, fax) to inform them of need for appointment prior to surgery.  If applicable, this message will also be routed to pharmacy pool and/or primary cardiologist for input on holding anticoagulant/antiplatelet agent as requested below so that this information is available at time of patient's appointment.   Matthew Lis, PA-C  06/15/2018, 10:11 AM

## 2018-06-15 NOTE — Telephone Encounter (Signed)
   [facility] Pre-operative Risk Assessment    Request for surgical clearance:  1. What type of surgery is being performed? EYE MUSCLE SURGERY   2. When is this surgery scheduled? 06/23/18   3. What type of clearance is required (medical clearance vs. Pharmacy clearance to hold med vs. Both)? MEDICAL  4. Are there any medications that need to be held prior to surgery and how long?PER SURGEON DR. Annamaria Boots BLOOD THINNERS DO NOT NEED TO BE HELD  5. Practice name and name of physician performing surgery? [facility] DR. Gwyndolyn Saxon YOUNG   6. What is your office phone number [phone]    7.   What is your office fax number [phone]  8.   Anesthesia type (None, local, MAC, general) ? GENERAL    Matthew Walker 06/15/2018, 9:23 AM  _________________________________________________________________   (provider comments below)

## 2018-06-15 NOTE — Telephone Encounter (Signed)
1st attempt to reach pt re: surgical clearance.  Left a detailed message for pt that he needs to be seen in the office before this can be addressed and to call back and make an appt.

## 2018-06-16 ENCOUNTER — Ambulatory Visit: Payer: Self-pay | Admitting: Ophthalmology

## 2018-06-16 NOTE — Telephone Encounter (Signed)
RETURNED CALL TO PT HE STATES THAT HE IS GOING TO POSTPONE HIS SURGERY TO THE MIDDLE OF MARCH. APPT SCHEDULED 3-5 WITH [facility] FOR THIS CARDIAC CLEARANCE

## 2018-06-16 NOTE — Telephone Encounter (Signed)
FAXED VIA EPIC FAX FUNTION TO REQUESTING PARTY 

## 2018-06-23 ENCOUNTER — Ambulatory Visit (HOSPITAL_BASED_OUTPATIENT_CLINIC_OR_DEPARTMENT_OTHER)
Admission: RE | Admit: 2018-06-23 | Payer: Federal, State, Local not specified - PPO | Source: Home / Self Care | Admitting: Ophthalmology

## 2018-06-23 ENCOUNTER — Encounter (HOSPITAL_BASED_OUTPATIENT_CLINIC_OR_DEPARTMENT_OTHER): Admission: RE | Payer: Self-pay | Source: Home / Self Care

## 2018-06-23 SURGERY — REPAIR STRABISMUS
Anesthesia: General | Laterality: Left

## 2018-06-29 ENCOUNTER — Ambulatory Visit: Payer: Federal, State, Local not specified - PPO | Admitting: Cardiology

## 2018-06-29 VITALS — BP 120/86 | HR 68 | Ht 70.0 in | Wt 193.0 lb

## 2018-06-29 DIAGNOSIS — Z01810 Encounter for preprocedural cardiovascular examination: Secondary | ICD-10-CM

## 2018-06-29 DIAGNOSIS — I252 Old myocardial infarction: Secondary | ICD-10-CM | POA: Diagnosis not present

## 2018-06-29 DIAGNOSIS — Z720 Tobacco use: Secondary | ICD-10-CM

## 2018-06-29 DIAGNOSIS — I2510 Atherosclerotic heart disease of native coronary artery without angina pectoris: Secondary | ICD-10-CM

## 2018-06-29 DIAGNOSIS — E785 Hyperlipidemia, unspecified: Secondary | ICD-10-CM

## 2018-06-29 DIAGNOSIS — I10 Essential (primary) hypertension: Secondary | ICD-10-CM | POA: Diagnosis not present

## 2018-06-29 DIAGNOSIS — E119 Type 2 diabetes mellitus without complications: Secondary | ICD-10-CM | POA: Diagnosis not present

## 2018-06-29 DIAGNOSIS — Z8673 Personal history of transient ischemic attack (TIA), and cerebral infarction without residual deficits: Secondary | ICD-10-CM | POA: Insufficient documentation

## 2018-06-29 DIAGNOSIS — I639 Cerebral infarction, unspecified: Secondary | ICD-10-CM

## 2018-06-29 DIAGNOSIS — Z9861 Coronary angioplasty status: Secondary | ICD-10-CM

## 2018-06-29 MED ORDER — NITROGLYCERIN 0.4 MG SL SUBL: 0.4000 mg | SUBLINGUAL_TABLET | SUBLINGUAL | 3 refills | Status: DC | PRN

## 2018-06-29 NOTE — Assessment & Plan Note (Signed)
[facility] study patient

## 2018-06-29 NOTE — Progress Notes (Signed)
06/29/2018 Matthew Walker   [birth year]  [id number]  Primary Physician Sasser, Clarene CritchleyPaul W, MD Primary Cardiologist: Dr Katrinka BlazingSmith  HPI: Matthew Walker is a pleasant 53 year old male, works as a Emergency planning/management officerproject manager for a [employer], who presented to [facility] April 2019 with an anterior ST elevation MI.  He was transferred to [facility].  He underwent urgent LAD intervention with a DES.  He had a staged circumflex and RCA PCI 48 hours later.  Echocardiogram showed preserved LV function with grade 2 diastolic dysfunction.  He was enrolled in the AE GIS study.  Other medical issues include hypertension, insulin-dependent diabetes, smoking, and dyslipidemia.  He was seen once 2 weeks later in follow-up.  In July 2019 he presented with double vision and had an acute stroke.  He was admitted.  Echocardiogram was unchanged showing good LV function no obvious clot.  P to Y 12 was 55, it was felt he was a Plavix responder.  He had bilateral vertebral basilar disease not amenable to PCI.  Apparently he was a late presentation and did not receive TPA.  He underwent cardiac rehab.  Initially he had double vision as well as some right arm weakness, his right arm weakness has improved.  He still has double vision and wears a patch on his left eye.  His ophthalmologist has suggested surgery to help this.  Apparently the patient will not be required to hold antiplatelet agents for this.  Symptomatically Matthew Walker is done well from a cardiac standpoint.  Since his stroke in July he has had some vague right chest discomfort and numbness.  He is not had any symptoms as he had on admission back in April 2019.     Current Outpatient Medications  Medication Sig Dispense Refill  . amLODipine (NORVASC) 10 MG tablet Take 10 mg by mouth daily.    Marland Kitchen. aspirin 325 MG tablet Take 1 tablet (325 mg total) by mouth daily.    Marland Kitchen. atorvastatin (LIPITOR) 80 MG tablet TAKE ONE TABLET BY MOUTH DAILY AT 6 PM. 90 tablet 2  .  clopidogrel (PLAVIX) 75 MG tablet TAKE ONE TABLET BY MOUTH DAILY WITH BREAKFAST. 90 tablet 0  . fexofenadine (ALLEGRA) 60 MG tablet Take 60 mg by mouth daily as needed for allergies or rhinitis.    . fluticasone (FLONASE) 50 MCG/ACT nasal spray Place 1 spray into both nostrils as needed for congestion.  12  . glipiZIDE (GLUCOTROL) 5 MG tablet Take 1 tablet (5 mg total) by mouth 2 (two) times daily. 60 tablet 11  . lisinopril-hydrochlorothiazide (PRINZIDE,ZESTORETIC) 20-25 MG tablet Take 1 tablet by mouth daily. 30 tablet 0  . metFORMIN (GLUCOPHAGE-XR) 500 MG 24 hr tablet Take 500 mg by mouth 2 (two) times daily.   3  . metoprolol tartrate (LOPRESSOR) 50 MG tablet Take 50 mg by mouth 2 (two) times daily.  12  . Multiple Vitamins-Minerals (EMERGEN-C IMMUNE PO) Take 1 packet by mouth daily.    . sodium chloride (OCEAN) 0.65 % SOLN nasal spray Place 1 spray into both nostrils as needed for congestion.    . nitroGLYCERIN (NITROSTAT) 0.4 MG SL tablet Place 1 tablet (0.4 mg total) under the tongue every 5 (five) minutes as needed for chest pain. 25 tablet 3   No current facility-administered medications for this visit.     Allergies  Allergen Reactions  . Bee Venom Other (See Comments)    Hay Fever    Past Medical History:  Diagnosis Date  .  Arthritis    "knees, hands" (11/18/2017)  . Coronary artery disease   . GERD (gastroesophageal reflux disease)   . High cholesterol   . Hypertension   . STEMI (ST elevation myocardial infarction) (HCC) 07/30/2017   PCI/DES x1 to mLAD with staged intervention to the pRCA, and Lcx. Normal EF  . Stroke (HCC) 11/18/2017   Acute/subacute left pontine CVA/notes 11/18/2017  . Type II diabetes mellitus (HCC)     Social History   Socioeconomic History  . Marital status: Married    Spouse name: Not on file  . Number of children: Not on file  . Years of education: Not on file  . Highest education level: Not on file  Occupational History  . Not on file    Social Needs  . Financial resource strain: Not on file  . Food insecurity:    Worry: Not on file    Inability: Not on file  . Transportation needs:    Medical: Not on file    Non-medical: Not on file  Tobacco Use  . Smoking status: Former Smoker    Packs/day: 1.00    Years: 36.00    Pack years: 36.00    Types: Cigarettes    Last attempt to quit: 07/29/2017    Years since quitting: 0.9  . Smokeless tobacco: Former Neurosurgeon    Types: Chew  Substance and Sexual Activity  . Alcohol use: Not Currently  . Drug use: Not Currently  . Sexual activity: Not on file  Lifestyle  . Physical activity:    Days per week: Not on file    Minutes per session: Not on file  . Stress: Not on file  Relationships  . Social connections:    Talks on phone: Not on file    Gets together: Not on file    Attends religious service: Not on file    Active member of club or organization: Not on file    Attends meetings of clubs or organizations: Not on file    Relationship status: Not on file  . Intimate partner violence:    Fear of current or ex partner: Not on file    Emotionally abused: Not on file    Physically abused: Not on file    Forced sexual activity: Not on file  Other Topics Concern  . Not on file  Social History Narrative  . Not on file     No family history on file.   Review of Systems: General: negative for chills, fever, night sweats or weight changes.  Cardiovascular: negative for chest pain, dyspnea on exertion, edema, orthopnea, palpitations, paroxysmal nocturnal dyspnea or shortness of breath Dermatological: negative for rash Respiratory: negative for cough or wheezing Urologic: negative for hematuria Abdominal: negative for nausea, vomiting, diarrhea, bright red blood per rectum, melena, or hematemesis Neurologic: negative for syncope, or dizziness All other systems reviewed and are otherwise negative except as noted above.    Blood pressure 120/86, pulse 68, height 5\' 10"   (1.778 m), weight 193 lb (87.5 kg).  General appearance: alert, cooperative, no distress and eye patch OS Neck: no carotid bruit and no JVD Lungs: clear to auscultation bilaterally Heart: regular rate and rhythm Extremities: no edema Skin: Skin color, texture, turgor normal. No rashes or lesions Neurologic: Grossly normal  EKG NSR, poor anterior RW, IVCD  ASSESSMENT AND PLAN:   Pre-operative cardiovascular examination Pt seen today for pre op clearance prior to eye surgery (he will not be required to hold plavix or ASA)  CAD- S/P PCI Urgent LAD PCI with DES in setting of anterior STEMI 07/30/17- followed by staged RCA and CFX and RCA PCI with DES 08/01/17  History of CVA (cerebrovascular accident) Pt presented  CVA July 2019. He did have bilateral vertebral artery stenosis which was not amenable to stenting without high risk.  Hypertension, essential Controlled  NIDDM-type 2 Followed by PCP  Tobacco use Still smokes 2-3 a day  Dyslipidemia, goal LDL below 70 AEGIS study patient   PLAN  From a cardiac standpoint he is OK to have surgery if antiplatelet Rx does not need to be held.  I can't comment on his risk of stroke, this should be referred to his neurologist.   Corine Shelter PA-C 06/29/2018 11:30 AM

## 2018-06-29 NOTE — Assessment & Plan Note (Addendum)
Pt presented  CVA July 2019. He did have bilateral vertebral artery stenosis which was not amenable to stenting without high risk.

## 2018-06-29 NOTE — Assessment & Plan Note (Signed)
Pt seen today for pre op clearance prior to eye surgery (he will not be required to hold plavix or ASA)

## 2018-06-29 NOTE — Assessment & Plan Note (Signed)
Still smokes 2-3 a day

## 2018-06-29 NOTE — Assessment & Plan Note (Signed)
Controlled.  

## 2018-06-29 NOTE — Assessment & Plan Note (Signed)
Followed by PCP

## 2018-06-29 NOTE — Patient Instructions (Signed)
Medication Instructions:  Your physician recommends that you continue on your current medications as directed. Please refer to the Current Medication list given to you today. If you need a refill on your cardiac medications before your next appointment, please call your pharmacy.   Lab work: None  If you have labs (blood work) drawn today and your tests are completely normal, you will receive your results only by: Marland Kitchen MyChart Message (if you have MyChart) OR . A paper copy in the mail If you have any lab test that is abnormal or we need to change your treatment, we will call you to review the results.  Testing/Procedures: None   Follow-Up: At [facility], you and your health needs are our priority.  As part of our continuing mission to provide you with exceptional heart care, we have created designated Provider Care Teams.  These Care Teams include your primary Cardiologist (physician) and Advanced Practice Providers (APPs -  Physician Assistants and Nurse Practitioners) who all work together to provide you with the care you need, when you need it. You will need a follow up appointment in 6 months.  Please call our office 2 months in advance to schedule this appointment.  You may see Lesleigh Noe, MD or one of the following Advanced Practice Providers on your designated Care Team:  PATIENT NEEDS TO SEE DR Katrinka Blazing ONLY Norma Fredrickson, NP Nada Boozer, NP . Georgie Chard, NP   Any Other Special Instructions Will Be Listed Below (If Applicable).

## 2018-06-29 NOTE — Assessment & Plan Note (Signed)
Urgent LAD PCI with DES in setting of anterior STEMI 07/30/17- followed by staged RCA and CFX and RCA PCI with DES 08/01/17

## 2018-07-19 ENCOUNTER — Other Ambulatory Visit: Payer: Self-pay | Admitting: Cardiology

## 2018-08-09 ENCOUNTER — Encounter: Payer: Self-pay | Admitting: *Deleted

## 2018-08-09 DIAGNOSIS — Z006 Encounter for examination for normal comparison and control in clinical research program: Secondary | ICD-10-CM

## 2018-08-09 NOTE — Research (Addendum)
[facility] Visit 11 Phone visit due to Covid 19 virus Pt doing well, no complaints of cp or sob. His BP is doing much better.  He is hoping like all this virus is over soon and things can get back to normal. No med changes per patient.  Thanked him for participating in the study, told him we would call him at the end when all the data came out.                                   "CONSENT"   YES     NO   Continuing further Investigational Product and study visits for follow-up? [x]  []   Continuing consent from future biomedical research [x]  []                                    "EVENTS"    YES     NO  AE   (IF YES SEE SOURCE) []  [x]   SAE  (IF YES SEE SOURCE) []  [x]   ENDPOINT   (IF YES SEE SOURCE) []  [x]   REVASCULARIZATION  (IF YES SEE SOURCE) []  [x]   AMPUTATION   (IF YES SEE SOURCE) []  [x]   TROPONIN'S  (IF YES SEE SOURCE) []  [x]    Lifestyle Adherence Assessment:    YES NO  Abstinence from smoking/remaining tobacco free X   Cardiac Diet X   Routine physical activity and/or cardiac rehabilitation X      Current Outpatient Medications:  .  amLODipine (NORVASC) 10 MG tablet, Take 10 mg by mouth daily., Disp: , Rfl:  .  aspirin 325 MG tablet, Take 1 tablet (325 mg total) by mouth daily., Disp: , Rfl:  .  atorvastatin (LIPITOR) 80 MG tablet, TAKE ONE TABLET BY MOUTH DAILY AT 6 PM., Disp: 90 tablet, Rfl: 2 .  clopidogrel (PLAVIX) 75 MG tablet, TAKE ONE TABLET BY MOUTH DAILY WITH BREAKFAST., Disp: 90 tablet, Rfl: 3 .  fexofenadine (ALLEGRA) 60 MG tablet, Take 60 mg by mouth daily as needed for allergies or rhinitis., Disp: , Rfl:  .  fluticasone (FLONASE) 50 MCG/ACT nasal spray, Place 1 spray into both nostrils as needed for congestion., Disp: , Rfl: 12 .  glipiZIDE (GLUCOTROL) 5 MG tablet, Take 1 tablet (5 mg total) by mouth 2 (two) times daily., Disp: 60 tablet, Rfl: 11 .  lisinopril-hydrochlorothiazide (PRINZIDE,ZESTORETIC) 20-25 MG tablet, Take 1 tablet by mouth daily., Disp: 30 tablet, Rfl:  0 .  metFORMIN (GLUCOPHAGE-XR) 500 MG 24 hr tablet, Take 500 mg by mouth 2 (two) times daily. , Disp: , Rfl: 3 .  metoprolol tartrate (LOPRESSOR) 50 MG tablet, Take 50 mg by mouth 2 (two) times daily., Disp: , Rfl: 12 .  Multiple Vitamins-Minerals (EMERGEN-C IMMUNE PO), Take 1 packet by mouth daily., Disp: , Rfl:  .  nitroGLYCERIN (NITROSTAT) 0.4 MG SL tablet, Place 1 tablet (0.4 mg total) under the tongue every 5 (five) minutes as needed for chest pain., Disp: 25 tablet, Rfl: 3 .  sodium chloride (OCEAN) 0.65 % SOLN nasal spray, Place 1 spray into both nostrils as needed for congestion., Disp: , Rfl:

## 2018-10-31 ENCOUNTER — Ambulatory Visit: Payer: Self-pay | Admitting: Ophthalmology

## 2018-11-06 ENCOUNTER — Telehealth: Payer: Self-pay | Admitting: *Deleted

## 2018-11-06 NOTE — Telephone Encounter (Signed)
   [facility] Pre-operative Risk Assessment    Request for surgical clearance:  1. What type of surgery is being performed? EYE MUSCLE SURGERY (THIS IS ALL THAT IS LISTED)  2. When is this surgery scheduled? 11/24/18  3. What type of clearance is required (medical clearance vs. Pharmacy clearance to hold med vs. Both)? MEDICAL  4. Are there any medications that need to be held prior to surgery and how long? PT IS ON PLAVIX AND ASA; THOUGH NO MEDS BEING REQUESTED TO BE HELD  5. Practice name and name of physician performing surgery? [facility] DR. Gwyndolyn Saxon YOUNG  6. What is your office phone number [phone]   7.   What is your office fax number [phone]  8.   Anesthesia type (None, local, MAC, general) ? GENERAL   Matthew Walker 11/06/2018, 4:52 PM  _________________________________________________________________   (provider comments below)

## 2018-11-07 NOTE — Telephone Encounter (Signed)
   Primary Cardiologist: Sinclair Grooms, MD  Chart reviewed as part of pre-operative protocol coverage. Patient was contacted 11/07/2018 in reference to pre-operative risk assessment for pending surgery as outlined below.  Matthew Walker was last seen on 06/29/18 by Kerin Ransom, PA for Dr. Tamala Julian, pt with MI in 2019 and emergent stent with cath.  He has also had a stroke.  Since that day, Matthew Walker has done very well.  Pt was cleared for this surgery on that day and has not had any issues since.  He will continue ASA and plavix.  Therefore, based on ACC/AHA guidelines, the patient would be at acceptable risk for the planned procedure without further cardiovascular testing.   I will route this recommendation to the requesting party via Epic fax function and remove from pre-op pool.  Please call with questions.  Cecilie Kicks, NP 11/07/2018, 9:56 AM

## 2018-11-09 DIAGNOSIS — H4922 Sixth [abducent] nerve palsy, left eye: Secondary | ICD-10-CM | POA: Diagnosis not present

## 2018-11-17 ENCOUNTER — Encounter (HOSPITAL_BASED_OUTPATIENT_CLINIC_OR_DEPARTMENT_OTHER): Payer: Self-pay | Admitting: *Deleted

## 2018-11-17 ENCOUNTER — Other Ambulatory Visit: Payer: Self-pay

## 2018-11-21 ENCOUNTER — Other Ambulatory Visit: Payer: Self-pay

## 2018-11-21 ENCOUNTER — Other Ambulatory Visit (HOSPITAL_COMMUNITY)
Admission: RE | Admit: 2018-11-21 | Discharge: 2018-11-21 | Disposition: A | Discharge status: Home / Self Care | Payer: Federal, State, Local not specified - PPO | Source: Ambulatory Visit | Attending: Ophthalmology | Admitting: Ophthalmology

## 2018-11-21 ENCOUNTER — Encounter (HOSPITAL_BASED_OUTPATIENT_CLINIC_OR_DEPARTMENT_OTHER)
Admission: RE | Admit: 2018-11-21 | Discharge: 2018-11-21 | Disposition: A | Discharge status: Home / Self Care | Payer: Federal, State, Local not specified - PPO | Source: Ambulatory Visit | Attending: Ophthalmology | Admitting: Ophthalmology

## 2018-11-21 DIAGNOSIS — Z20828 Contact with and (suspected) exposure to other viral communicable diseases: Secondary | ICD-10-CM | POA: Diagnosis not present

## 2018-11-21 DIAGNOSIS — Z01812 Encounter for preprocedural laboratory examination: Secondary | ICD-10-CM | POA: Insufficient documentation

## 2018-11-21 LAB — BASIC METABOLIC PANEL
Anion gap: 11 (ref 5–15)
BUN: 15 mg/dL (ref 6–20)
CO2: 24 mmol/L (ref 22–32)
Calcium: 9.3 mg/dL (ref 8.9–10.3)
Chloride: 102 mmol/L (ref 98–111)
Creatinine, Ser: 0.89 mg/dL (ref 0.61–1.24)
GFR calc Af Amer: >60 mL/min (ref >60)
GFR calc non Af Amer: >60 mL/min (ref >60)
Glucose, Bld: 226 mg/dL — ABNORMAL HIGH (ref 70–99)
Potassium: 4.3 mmol/L (ref 3.5–5.1)
Sodium: 137 mmol/L (ref 135–145)

## 2018-11-21 LAB — SARS CORONAVIRUS 2 (TAT 6-24 HRS): SARS Coronavirus 2: NEGATIVE

## 2018-11-24 ENCOUNTER — Ambulatory Visit (HOSPITAL_BASED_OUTPATIENT_CLINIC_OR_DEPARTMENT_OTHER): Payer: Federal, State, Local not specified - PPO | Admitting: Anesthesiology

## 2018-11-24 ENCOUNTER — Ambulatory Visit (HOSPITAL_BASED_OUTPATIENT_CLINIC_OR_DEPARTMENT_OTHER)
Admission: RE | Admit: 2018-11-24 | Discharge: 2018-11-24 | Disposition: A | Discharge status: Home / Self Care | Payer: Federal, State, Local not specified - PPO | Attending: Ophthalmology | Admitting: Ophthalmology

## 2018-11-24 ENCOUNTER — Encounter (HOSPITAL_BASED_OUTPATIENT_CLINIC_OR_DEPARTMENT_OTHER)
Admission: RE | Disposition: A | Discharge status: Home / Self Care | Payer: Self-pay | Source: Home / Self Care | Attending: Ophthalmology

## 2018-11-24 ENCOUNTER — Other Ambulatory Visit: Payer: Self-pay

## 2018-11-24 ENCOUNTER — Encounter (HOSPITAL_BASED_OUTPATIENT_CLINIC_OR_DEPARTMENT_OTHER): Payer: Self-pay | Admitting: Emergency Medicine

## 2018-11-24 ENCOUNTER — Ambulatory Visit: Payer: Self-pay | Admitting: Ophthalmology

## 2018-11-24 DIAGNOSIS — I10 Essential (primary) hypertension: Secondary | ICD-10-CM | POA: Insufficient documentation

## 2018-11-24 DIAGNOSIS — I252 Old myocardial infarction: Secondary | ICD-10-CM | POA: Insufficient documentation

## 2018-11-24 DIAGNOSIS — G4730 Sleep apnea, unspecified: Secondary | ICD-10-CM | POA: Diagnosis not present

## 2018-11-24 DIAGNOSIS — M159 Polyosteoarthritis, unspecified: Secondary | ICD-10-CM | POA: Diagnosis not present

## 2018-11-24 DIAGNOSIS — Z955 Presence of coronary angioplasty implant and graft: Secondary | ICD-10-CM | POA: Insufficient documentation

## 2018-11-24 DIAGNOSIS — E7800 Pure hypercholesterolemia, unspecified: Secondary | ICD-10-CM | POA: Diagnosis not present

## 2018-11-24 DIAGNOSIS — Z8673 Personal history of transient ischemic attack (TIA), and cerebral infarction without residual deficits: Secondary | ICD-10-CM | POA: Insufficient documentation

## 2018-11-24 DIAGNOSIS — F17200 Nicotine dependence, unspecified, uncomplicated: Secondary | ICD-10-CM | POA: Insufficient documentation

## 2018-11-24 DIAGNOSIS — H4922 Sixth [abducent] nerve palsy, left eye: Secondary | ICD-10-CM | POA: Insufficient documentation

## 2018-11-24 DIAGNOSIS — E119 Type 2 diabetes mellitus without complications: Secondary | ICD-10-CM | POA: Insufficient documentation

## 2018-11-24 DIAGNOSIS — Z7984 Long term (current) use of oral hypoglycemic drugs: Secondary | ICD-10-CM | POA: Diagnosis not present

## 2018-11-24 DIAGNOSIS — I2510 Atherosclerotic heart disease of native coronary artery without angina pectoris: Secondary | ICD-10-CM | POA: Insufficient documentation

## 2018-11-24 HISTORY — PX: STRABISMUS SURGERY: SHX218

## 2018-11-24 HISTORY — DX: Sleep apnea, unspecified: G47.30

## 2018-11-24 LAB — GLUCOSE, CAPILLARY
Glucose-Capillary: 180 mg/dL — ABNORMAL HIGH (ref 70–99)
Glucose-Capillary: 179 mg/dL — ABNORMAL HIGH (ref 70–99)

## 2018-11-24 SURGERY — REPAIR STRABISMUS
Anesthesia: General | Site: Eye | Laterality: Left

## 2018-11-24 MED ORDER — TOBRAMYCIN-DEXAMETHASONE 0.3-0.1 % OP OINT
TOPICAL_OINTMENT | OPHTHALMIC | Status: DC | PRN
  Administered 2018-11-24: 1 via OPHTHALMIC

## 2018-11-24 MED ORDER — ONDANSETRON HCL 4 MG/2ML IJ SOLN: 4.0000 mg | Freq: Once | INTRAMUSCULAR | Status: DC | PRN

## 2018-11-24 MED ORDER — DEXAMETHASONE SODIUM PHOSPHATE 4 MG/ML IJ SOLN
INTRAMUSCULAR | Status: DC | PRN
  Administered 2018-11-24: 10 mg via INTRAVENOUS

## 2018-11-24 MED ORDER — FENTANYL CITRATE (PF) 100 MCG/2ML IJ SOLN
INTRAMUSCULAR | Status: AC
  Filled 2018-11-24: qty 2

## 2018-11-24 MED ORDER — LACTATED RINGERS IV SOLN
INTRAVENOUS | Status: DC
  Administered 2018-11-24 (×2): via INTRAVENOUS

## 2018-11-24 MED ORDER — MIDAZOLAM HCL 2 MG/2ML IJ SOLN
1.0000 mg | INTRAMUSCULAR | Status: DC | PRN
  Administered 2018-11-24: 09:00:00 2 mg via INTRAVENOUS

## 2018-11-24 MED ORDER — FENTANYL CITRATE (PF) 100 MCG/2ML IJ SOLN: 25.0000 ug | INTRAMUSCULAR | Status: DC | PRN

## 2018-11-24 MED ORDER — LIDOCAINE 2% (20 MG/ML) 5 ML SYRINGE
INTRAMUSCULAR | Status: AC
  Filled 2018-11-24: qty 5

## 2018-11-24 MED ORDER — DEXAMETHASONE SODIUM PHOSPHATE 10 MG/ML IJ SOLN
INTRAMUSCULAR | Status: AC
  Filled 2018-11-24: qty 1

## 2018-11-24 MED ORDER — SCOPOLAMINE 1 MG/3DAYS TD PT72: 1.0000 | MEDICATED_PATCH | Freq: Once | TRANSDERMAL | Status: DC

## 2018-11-24 MED ORDER — MEPERIDINE HCL 25 MG/ML IJ SOLN: 6.2500 mg | INTRAMUSCULAR | Status: DC | PRN

## 2018-11-24 MED ORDER — EPHEDRINE 5 MG/ML INJ
INTRAVENOUS | Status: AC
  Filled 2018-11-24: qty 10

## 2018-11-24 MED ORDER — LIDOCAINE 2% (20 MG/ML) 5 ML SYRINGE
INTRAMUSCULAR | Status: DC | PRN
  Administered 2018-11-24: 80 mg via INTRAVENOUS

## 2018-11-24 MED ORDER — ONDANSETRON HCL 4 MG/2ML IJ SOLN
INTRAMUSCULAR | Status: DC | PRN
  Administered 2018-11-24: 4 mg via INTRAVENOUS

## 2018-11-24 MED ORDER — EPHEDRINE SULFATE 50 MG/ML IJ SOLN
INTRAMUSCULAR | Status: DC | PRN
  Administered 2018-11-24: 15 mg via INTRAVENOUS

## 2018-11-24 MED ORDER — ARTIFICIAL TEARS OPHTHALMIC OINT
TOPICAL_OINTMENT | OPHTHALMIC | Status: AC
  Filled 2018-11-24: qty 3.5

## 2018-11-24 MED ORDER — PHENYLEPHRINE HCL (PRESSORS) 10 MG/ML IV SOLN
INTRAVENOUS | Status: AC
  Filled 2018-11-24: qty 1

## 2018-11-24 MED ORDER — PHENYLEPHRINE HCL (PRESSORS) 10 MG/ML IV SOLN
INTRAVENOUS | Status: DC | PRN
  Administered 2018-11-24: 40 ug via INTRAVENOUS

## 2018-11-24 MED ORDER — ONDANSETRON HCL 4 MG/2ML IJ SOLN
INTRAMUSCULAR | Status: AC
  Filled 2018-11-24: qty 2

## 2018-11-24 MED ORDER — PROPOFOL 10 MG/ML IV BOLUS
INTRAVENOUS | Status: DC | PRN
  Administered 2018-11-24: 150 mg via INTRAVENOUS

## 2018-11-24 MED ORDER — FENTANYL CITRATE (PF) 100 MCG/2ML IJ SOLN
50.0000 ug | INTRAMUSCULAR | Status: DC | PRN
  Administered 2018-11-24 (×2): 50 ug via INTRAVENOUS

## 2018-11-24 MED ORDER — MIDAZOLAM HCL 2 MG/2ML IJ SOLN
INTRAMUSCULAR | Status: AC
  Filled 2018-11-24: qty 2

## 2018-11-24 SURGICAL SUPPLY — 31 items
APPLICATOR COTTON TIP 6 STRL (MISCELLANEOUS) ×4 IMPLANT
APPLICATOR COTTON TIP 6IN STRL (MISCELLANEOUS) ×8
APPLICATOR DR MATTHEWS STRL (MISCELLANEOUS) ×2 IMPLANT
BNDG EYE OVAL (GAUZE/BANDAGES/DRESSINGS) IMPLANT
CAUTERY EYE LOW TEMP 1300F FIN (OPHTHALMIC RELATED) IMPLANT
COVER BACK TABLE REUSABLE LG (DRAPES) ×2 IMPLANT
COVER MAYO STAND REUSABLE (DRAPES) ×2 IMPLANT
COVER WAND RF STERILE (DRAPES) IMPLANT
DRAPE HALF SHEET 70X43 (DRAPES) IMPLANT
DRAPE SPLIT 6X30 W/TAPE (DRAPES) ×2 IMPLANT
DRAPE SURG 17X23 STRL (DRAPES) ×4 IMPLANT
GLOVE BIO SURGEON STRL SZ 6.5 (GLOVE) ×4 IMPLANT
GLOVE BIOGEL M STRL SZ7.5 (GLOVE) ×2 IMPLANT
GOWN STRL REUS W/ TWL LRG LVL3 (GOWN DISPOSABLE) ×1 IMPLANT
GOWN STRL REUS W/TWL LRG LVL3 (GOWN DISPOSABLE) ×1
GOWN STRL REUS W/TWL XL LVL3 (GOWN DISPOSABLE) ×2 IMPLANT
NS IRRIG 1000ML POUR BTL (IV SOLUTION) ×2 IMPLANT
PACK BASIN DAY SURGERY FS (CUSTOM PROCEDURE TRAY) ×2 IMPLANT
SPEAR EYE SURG WECK-CEL (MISCELLANEOUS) ×4 IMPLANT
STRIP CLOSURE SKIN 1/4X4 (GAUZE/BANDAGES/DRESSINGS) IMPLANT
SUT 6 0 SILK T G140 8DA (SUTURE) IMPLANT
SUT MERSILENE 6-0 18IN S14 8MM (SUTURE)
SUT PLAIN 6 0 TG1408 (SUTURE) ×2 IMPLANT
SUT SILK 4 0 C 3 735G (SUTURE) IMPLANT
SUT VICRYL 6 0 S 28 (SUTURE) ×2 IMPLANT
SUT VICRYL ABS 6-0 S29 18IN (SUTURE) ×4 IMPLANT
SUTURE MERSLN 6-0 18IN S14 8MM (SUTURE) IMPLANT
SYR 10ML LL (SYRINGE) ×2 IMPLANT
SYR TB 1ML LL NO SAFETY (SYRINGE) ×2 IMPLANT
TOWEL GREEN STERILE FF (TOWEL DISPOSABLE) ×2 IMPLANT
TRAY DSU PREP LF (CUSTOM PROCEDURE TRAY) ×2 IMPLANT

## 2018-11-24 NOTE — H&P (View-Only) (Signed)
Date of examination:  11-09-18  Indication for surgery: to straighten the eyes and relieve diplopia and left face turn  Pertinent past medical history:  Past Medical History:  Diagnosis Date  . Arthritis    "knees, hands" (11/18/2017)  . Coronary artery disease   . GERD (gastroesophageal reflux disease)   . High cholesterol   . Hypertension   . Sleep apnea    no CPAP  . STEMI (ST elevation myocardial infarction) ([place]) 07/30/2017   PCI/DES x1 to mLAD with staged intervention to the pRCA, and Lcx. Normal EF  . Stroke ([place]) 11/18/2017   Acute/subacute left pontine CVA/notes 11/18/2017  . Type II diabetes mellitus ([place])     Pertinent ocular history:  Left 6th nerve palsy with CVA 7/19  Pertinent family history: No family history on file.  General:  Healthy appearing patient in no distress.    Eyes:    Acuity  cc OD 20/20  OS 20/20  External: Within normal limits x left face turn  Anterior segment: Within normal limits     Motility:   LET=32, 12 in R gaze, 50 in L gaze, ET' 35, 2-LLR  Fundus: deferred  Refraction:  myopic  Heart: Regular rate and rhythm without murmur     Lungs: Clear to auscultation      Impression:L 6th nerve palsy x 1 year, with compensatory left face turn  Plan: Left medial rectus muscle recession, left lateral rectus muscle resection  Derry Skill

## 2018-11-24 NOTE — Anesthesia Procedure Notes (Signed)
Procedure Name: LMA Insertion Performed by: Stony Stegmann W, CRNA Pre-anesthesia Checklist: Patient identified, Emergency Drugs available, Suction available and Patient being monitored Patient Re-evaluated:Patient Re-evaluated prior to induction Oxygen Delivery Method: Circle system utilized Preoxygenation: Pre-oxygenation with 100% oxygen Induction Type: IV induction Ventilation: Mask ventilation without difficulty LMA: LMA flexible inserted LMA Size: 4.0 Number of attempts: 1 Placement Confirmation: positive ETCO2 Tube secured with: Tape Dental Injury: Teeth and Oropharynx as per pre-operative assessment        

## 2018-11-24 NOTE — Anesthesia Postprocedure Evaluation (Signed)
Anesthesia Post Note  Patient: Matthew Walker  Procedure(s) Performed: STRABISMUS REPAIR LEFT EYE (Left Eye)     Patient location during evaluation: Phase II Anesthesia Type: General Level of consciousness: awake Pain management: pain level controlled Vital Signs Assessment: post-procedure vital signs reviewed and stable Respiratory status: spontaneous breathing Cardiovascular status: stable Postop Assessment: no apparent nausea or vomiting Anesthetic complications: no    Last Vitals:  Vitals:   11/24/18 1100 11/24/18 1130  BP: (!) 136/94 (!) 142/90  Pulse: 74 69  Resp: 15 16  Temp:  36.8 C  SpO2: 96% 96%    Last Pain:  Vitals:   11/24/18 1130  TempSrc:   PainSc: 2    Pain Goal:                   [name]

## 2018-11-24 NOTE — Op Note (Signed)
11/24/2018  10:11 AM  PATIENT:  Matthew Walker    PRE-OPERATIVE DIAGNOSIS:  Left 6th nerve palsy  POST-OPERATIVE DIAGNOSIS:  same  PROCEDURE:  1. Medial rectus muscle recession 4.5 mm left eye   2.  Lateral rectus muscle resectiion 9.0 mm left eye    SURGEON:  Derry Skill, MD  ANESTHESIA:   General  COMPLICATIONS: none  OPERATIVE PROCEDURE: After routine preoperative evaluation including informed consent, the patient was taken to the operating room where He was identified by me. General anesthesia was induced without difficulty after placement of appropriate monitors. The patient was prepped and draped in standard sterile fashion. A lid speculum was placed in the left eye.   Through an inferonasal fornix incision, the left medial rectus muscle was engaged on a series of muscle hooks and cleared of its fascial attachments. The tendon was secured with a double-armed 6-0 Vicryl suture, with a locking bite at each border of the muscle, 1 mm from the insertion. The muscle was disinserted.  It was reattached to sclera at a measured distance of 4.5 mm posterior to the original insertion, using direct scleral passes in crossed swords fashion. The suture ends were tied securely after the position of the muscle had been checked and found to be accurate. Conjunctiva was closed with a single 6-0 vicryl suture.  Through an inferotemporal fornix incision through conjunctiva and Tenon's fascia, the right lateral rectus muscle was engaged on a series of muscle hooks and carefully cleared of its fascial attachments to at least 15 mm posterior to the insertion. The muscle was spread between 2 self-retaining hooks. A 2 mm bite was taken of the center of the muscle belly at a measured distance of 9.0 mm posterior to the insertion, and a knot was tied securely at this location. The needle at each end of the double-armed suture was passed from the center of the muscle belly to the periphery, parallel to and 9.0 mm  posterior to the insertion. A resection clamp was placed on the muscle just anterior to the sutures. The muscle was disinserted. Each pole suture was passed posteriorly to anteriorly through the corresponding end of the muscle stump, then anteriorly to posteriorly near the center of the stump, then posteriorly to anteriorly through the center of the muscle belly, just posterior to the previously placed knot.  The muscle was drawn up to the level of the original insertion, and all slack was removed.  The suture ends were tied securely. The resection clamp was removed.  The portion of the muscle anterior to the sutures was carefully excised. Conjunctiva was closed with a single 6-0 Vicryl suture.   Tobradex ophthalmic ointment was placed in left eye. The patient was awakened without difficulty and taken to the recovery room in stable condition, having suffered no intraoperative or immediate postoperative complications. Derry Skill, MD

## 2018-11-24 NOTE — Anesthesia Preprocedure Evaluation (Signed)
Anesthesia Evaluation  Patient identified by MRN, date of birth, ID band Patient awake    Reviewed: Allergy & Precautions, NPO status , Patient's Chart, lab work & pertinent test results  Airway Mallampati: I       Dental no notable dental hx. (+) Teeth Intact   Pulmonary neg pulmonary ROS, Current Smoker,    Pulmonary exam normal breath sounds clear to auscultation       Cardiovascular hypertension, Pt. on medications and Pt. on home beta blockers + CAD, + Past MI and + Cardiac Stents  Normal cardiovascular exam Rhythm:Regular Rate:Normal     Neuro/Psych negative psych ROS   GI/Hepatic Neg liver ROS,   Endo/Other  diabetes, Type 2, Oral Hypoglycemic Agents  Renal/GU      Musculoskeletal   Abdominal Normal abdominal exam  (+)   Peds  Hematology negative hematology ROS (+)   Anesthesia Other Findings   Reproductive/Obstetrics                             Anesthesia Physical Anesthesia Plan  ASA: III  Anesthesia Plan: General   Post-op Pain Management:    Induction: Intravenous  PONV Risk Score and Plan: 1 and Ondansetron  Airway Management Planned: LMA  Additional Equipment:   Intra-op Plan:   Post-operative Plan: Extubation in OR  Informed Consent: I have reviewed the patients History and Physical, chart, labs and discussed the procedure including the risks, benefits and alternatives for the proposed anesthesia with the patient or authorized representative who has indicated his/her understanding and acceptance.     Dental advisory given  Plan Discussed with: CRNA  Anesthesia Plan Comments:         Anesthesia Quick Evaluation

## 2018-11-24 NOTE — Interval H&P Note (Signed)
History and Physical Interval Note:  11/24/2018 8:45 AM  Matthew Walker  has presented today for surgery, with the diagnosis of [place].  The various methods of treatment have been discussed with the patient and family. After consideration of risks, benefits and other options for treatment, the patient has consented to  Procedure(s): REPAIR STRABISMUS (Left) as a surgical intervention.  The patient's history has been reviewed, patient examined, no change in status, stable for surgery.  I have reviewed the patient's chart and labs.  Questions were answered to the patient's satisfaction.     Derry Skill

## 2018-11-24 NOTE — H&P (Signed)
Date of examination:  11-09-18  Indication for surgery: to straighten the eyes and relieve diplopia and left face turn  Pertinent past medical history:  Past Medical History:  Diagnosis Date  . Arthritis    "knees, hands" (11/18/2017)  . Coronary artery disease   . GERD (gastroesophageal reflux disease)   . High cholesterol   . Hypertension   . Sleep apnea    no CPAP  . STEMI (ST elevation myocardial infarction) (HCC) 07/30/2017   PCI/DES x1 to mLAD with staged intervention to the pRCA, and Lcx. Normal EF  . Stroke (HCC) 11/18/2017   Acute/subacute left pontine CVA/notes 11/18/2017  . Type II diabetes mellitus (HCC)     Pertinent ocular history:  Left 6th nerve palsy with CVA 7/19  Pertinent family history: No family history on file.  General:  Healthy appearing patient in no distress.    Eyes:    Acuity  cc OD 20/20  OS 20/20  External: Within normal limits x left face turn  Anterior segment: Within normal limits     Motility:   LET=32, 12 in R gaze, 50 in L gaze, ET' 35, 2-LLR  Fundus: deferred  Refraction:  myopic  Heart: Regular rate and rhythm without murmur     Lungs: Clear to auscultation      Impression:L 6th nerve palsy x 1 year, with compensatory left face turn  Plan: Left medial rectus muscle recession, left lateral rectus muscle resection  Tish Begin O Marena Witts  

## 2018-11-24 NOTE — Transfer of Care (Signed)
Immediate Anesthesia Transfer of Care Note  Patient: Matthew Walker  Procedure(s) Performed: STRABISMUS REPAIR LEFT EYE (Left Eye)  Patient Location: PACU  Anesthesia Type:General  Level of Consciousness: awake and sedated  Airway & Oxygen Therapy: Patient Spontanous Breathing and Patient connected to nasal cannula oxygen  Post-op Assessment: Report given to RN and Post -op Vital signs reviewed and stable  Post vital signs: Reviewed and stable  Last Vitals:  Vitals Value Taken Time  BP 100/74 11/24/18 1007  Temp    Pulse 71 11/24/18 1010  Resp 14 11/24/18 1010  SpO2 97 % 11/24/18 1010  Vitals shown include unvalidated device data.  Last Pain:  Vitals:   11/24/18 0755  TempSrc: Oral  PainSc: 0-No pain         Complications: No apparent anesthesia complications

## 2018-11-24 NOTE — Discharge Instructions (Signed)
Diet: Clear liquids, advance to soft foods then regular diet as tolerated by this evening.  Pain control:   1)  Ibuprofen 350-700 mg by mouth every 4-6 hours as needed for pain  2)  Ice pack/cold compress to operated eye(s) as desired  Eye medications:    Tobradex or Zylet eye ointment 1/2 inch in operated eye(s) twice a day   Activity: No swimming for 1 week.  It is OK to let water run over the face and eyes while showering or taking a bath, even during the first week.  No other restriction on exercise or activity.   Call Dr. Janee Morn office [phone] with any problems or concerns.   Post Anesthesia Home Care Instructions  Activity: Get plenty of rest for the remainder of the day. A responsible individual must stay with you for 24 hours following the procedure.  For the next 24 hours, DO NOT: -Drive a car -Paediatric nurse -Drink alcoholic beverages -Take any medication unless instructed by your physician -Make any legal decisions or sign important papers.  Meals: Start with liquid foods such as gelatin or soup. Progress to regular foods as tolerated. Avoid greasy, spicy, heavy foods. If nausea and/or vomiting occur, drink only clear liquids until the nausea and/or vomiting subsides. Call your physician if vomiting continues.  Special Instructions/Symptoms: Your throat may feel dry or sore from the anesthesia or the breathing tube placed in your throat during surgery. If this causes discomfort, gargle with warm salt water. The discomfort should disappear within 24 hours.  If you had a scopolamine patch placed behind your ear for the management of post- operative nausea and/or vomiting:  1. The medication in the patch is effective for 72 hours, after which it should be removed.  Wrap patch in a tissue and discard in the trash. Wash hands thoroughly with soap and water. 2. You may remove the patch earlier than 72 hours if you experience unpleasant side effects which may include  dry mouth, dizziness or visual disturbances. 3. Avoid touching the patch. Wash your hands with soap and water after contact with the patch.

## 2018-11-27 ENCOUNTER — Encounter (HOSPITAL_BASED_OUTPATIENT_CLINIC_OR_DEPARTMENT_OTHER): Payer: Self-pay | Admitting: Ophthalmology

## 2018-12-19 DIAGNOSIS — J069 Acute upper respiratory infection, unspecified: Secondary | ICD-10-CM | POA: Diagnosis not present

## 2018-12-19 DIAGNOSIS — J321 Chronic frontal sinusitis: Secondary | ICD-10-CM | POA: Diagnosis not present

## 2019-04-21 ENCOUNTER — Other Ambulatory Visit: Payer: Self-pay | Admitting: Cardiology

## 2020-02-14 IMAGING — CT CT HEAD W/O CM
4 series · 16 of 47 positions shown, 18 images · non-contrast
Comparison: 09/09/2012; correlation MR brain 11/18/2017

CLINICAL DATA: Focal neural deficit of greater than 6 hours
suspected stroke, increased RIGHT side numbness, recent stroke with
RIGHT-sided deficits but today felt like symptoms had worsened,
history coronary artery disease post NSTEMI, type II diabetes
mellitus, hypertension, former smoker

EXAM:
CT HEAD WITHOUT CONTRAST
TECHNIQUE: Contiguous axial images were obtained from the base of the skull
through the vertex without intravenous contrast.

[Series 3: head without · axial · non-contrast · 0.43mm/px · z∈[-87,+33]mm · 7 of 34 slices shown, 9 images]
[im 5/34  brain]
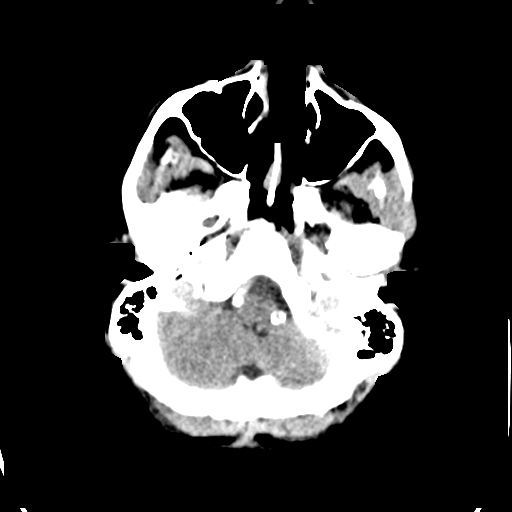
[im 5/34  bone]
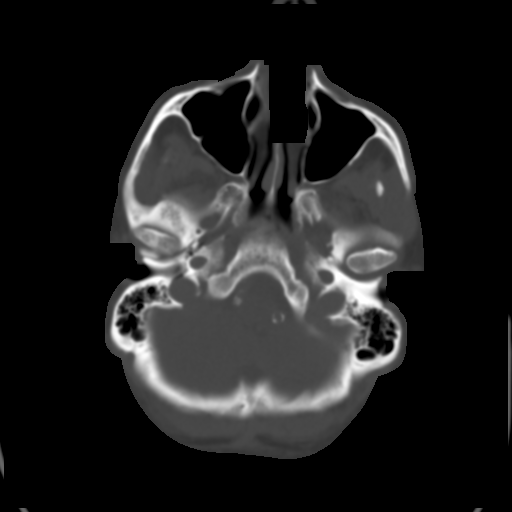
[im 9/34  brain]
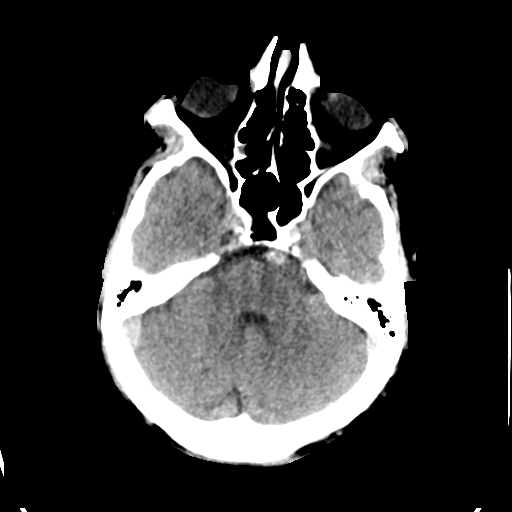
[im 13/34  brain]
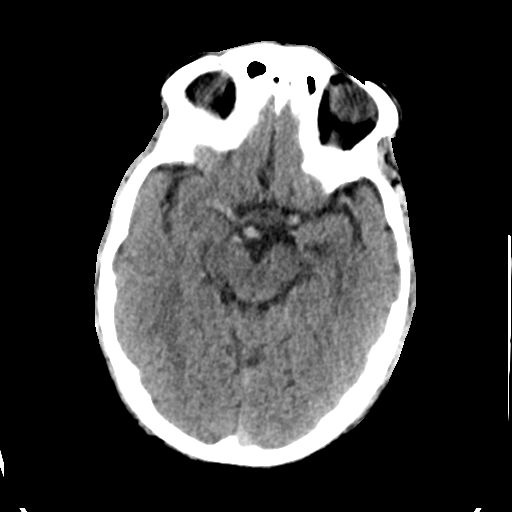
[im 17/34  brain]
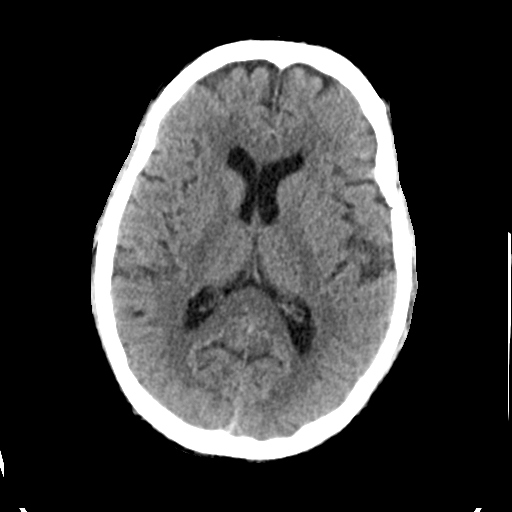
[im 21/34  brain]
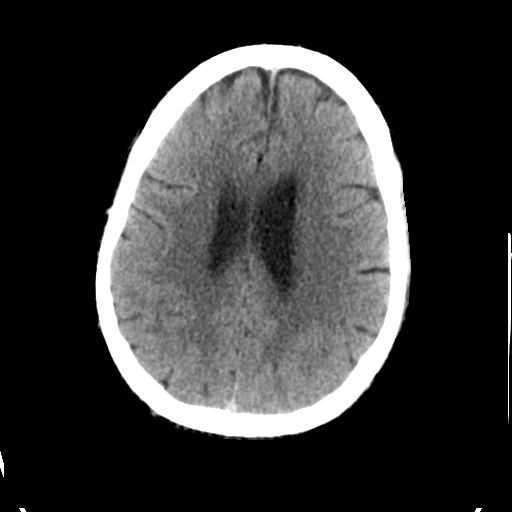
[im 21/34  bone]
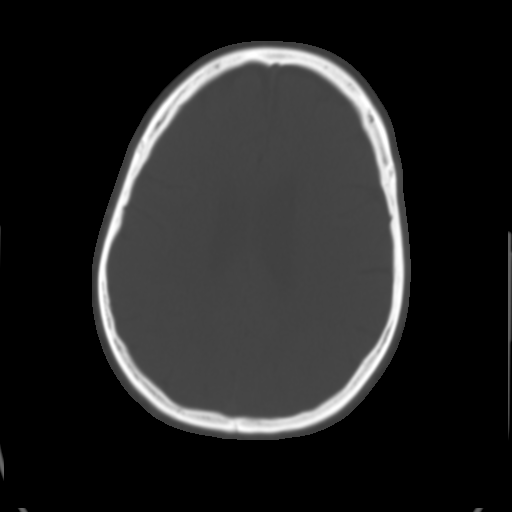
[im 25/34  brain]
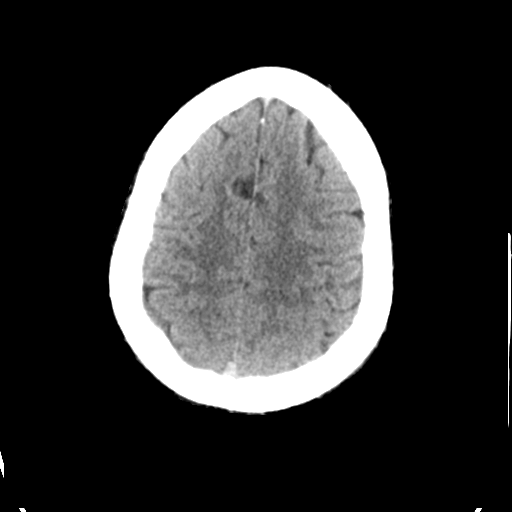
[im 29/34  brain]
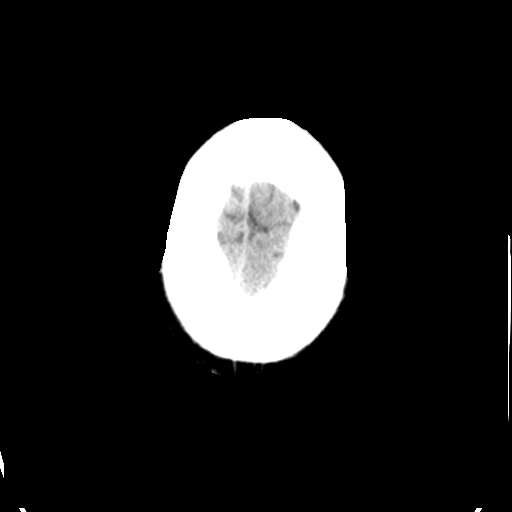

[Series 4: head bone · axial · 0.43mm/px · z∈[-91,-59]mm · 3 of 83 slices shown]
[im 9/83  bone]
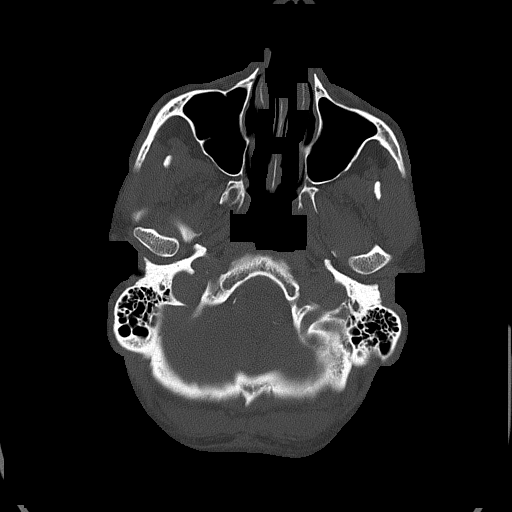
[im 17/83  bone]
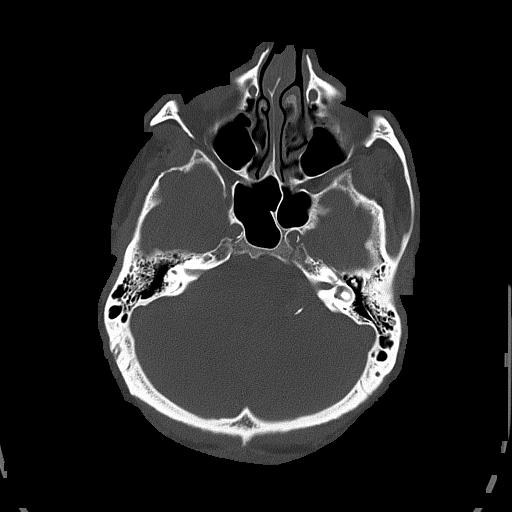
[im 25/83  bone]
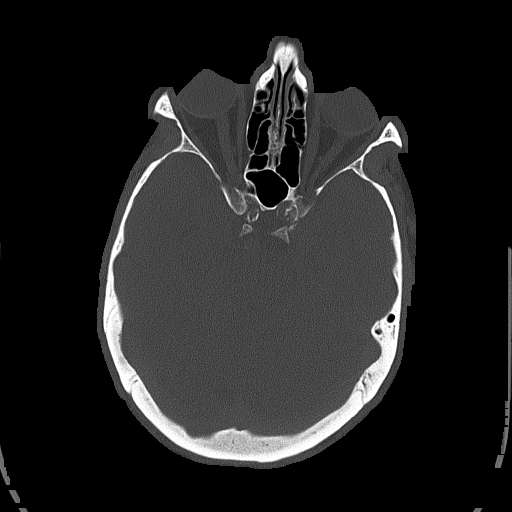

[Series 5: head without cor · coronal · non-contrast · 0.32mm/px · 3 of 71 slices shown]
[im 24/71  brain]
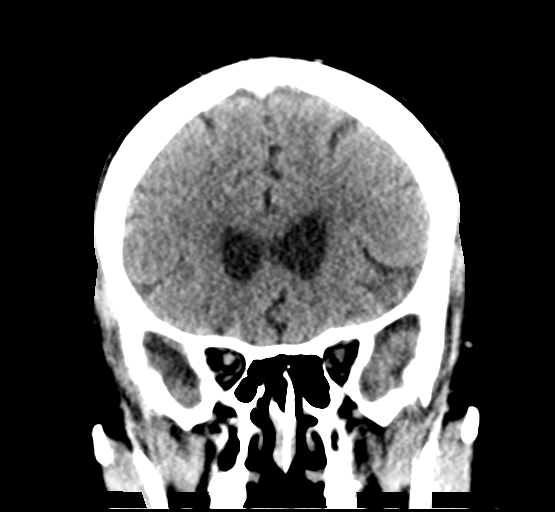
[im 32/71  brain]
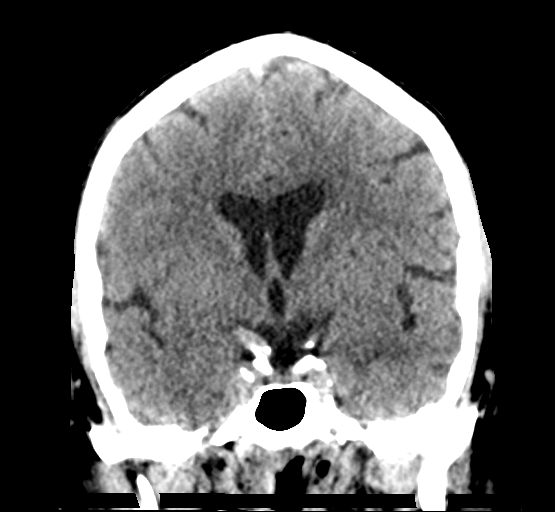
[im 39/71  brain]
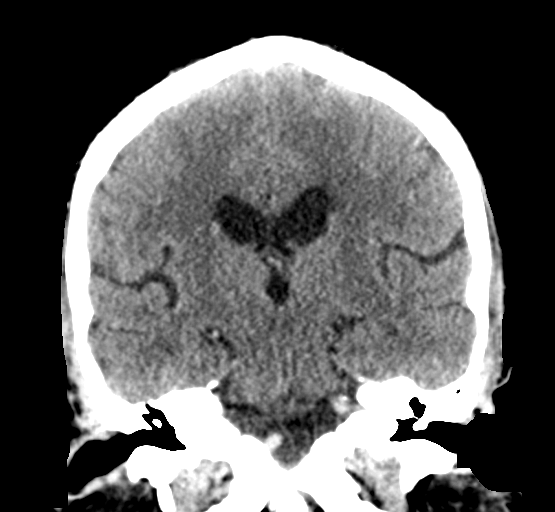

[Series 6: head without sag · sagittal · non-contrast · 0.32mm/px · 3 of 56 slices shown]
[im 19/56  brain]
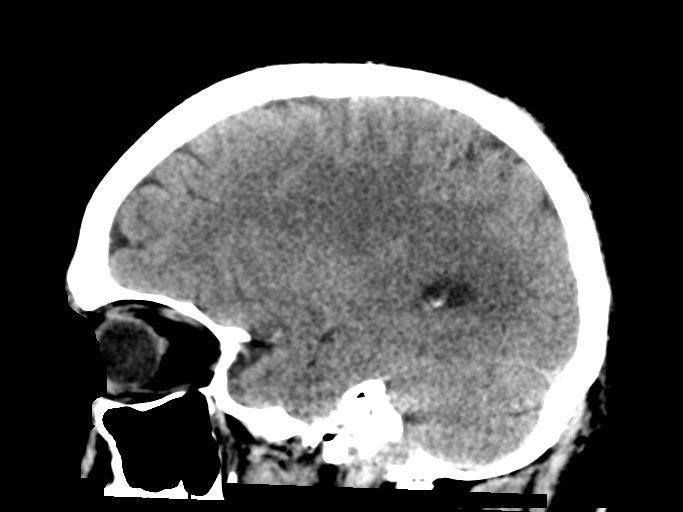
[im 28/56  brain]
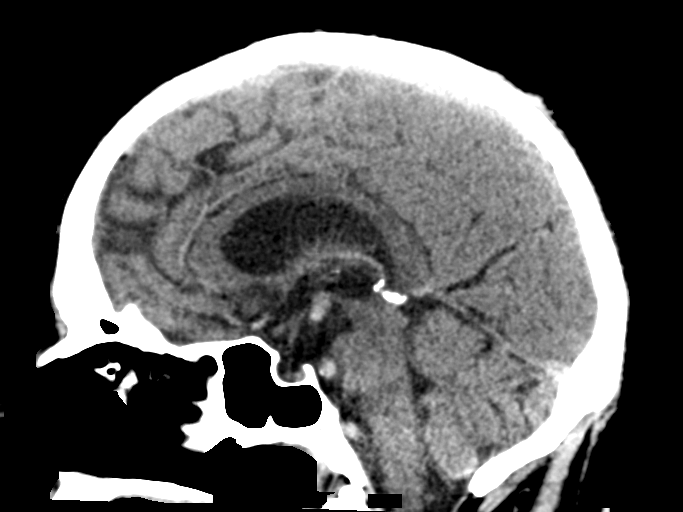
[im 37/56  brain]
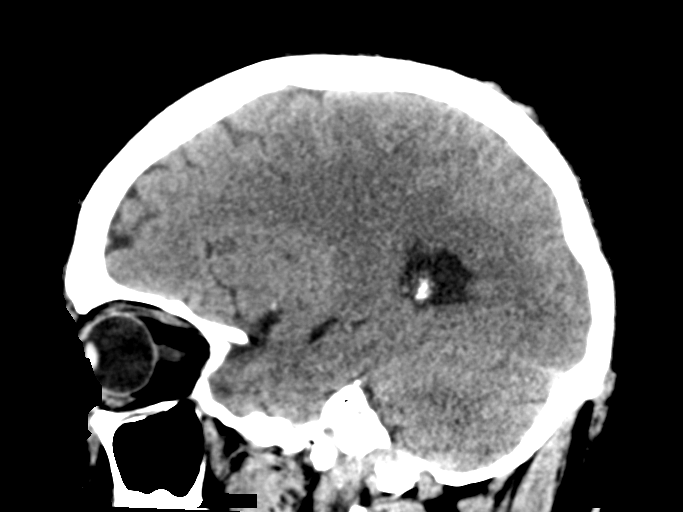

[16 of 47 positions shown; findings below may reference images not displayed]

FINDINGS: Brain: Normal ventricular morphology. No midline shift or mass
effect. Small focus of low attenuation at the inferior LEFT pons
corresponding to site of infarct on prior MR. Otherwise normal
appearance of brain parenchyma. No intracranial hemorrhage, mass
lesion, or evidence of acute infarction. No extra-axial fluid
collections.

Vascular: Atherosclerotic calcification of internal carotid and
vertebral arteries bilaterally at skull base

Skull: Intact

Sinuses/Orbits: Clear

Other: N/A
IMPRESSION: Small pontine infarct as noted on recent MRI of 11/18/2017.

No new intracranial abnormalities.

## 2020-02-14 IMAGING — MR MR HEAD W/O CM
10 of 11 series · 43 of 48 positions shown · non-contrast
Comparison: CT head earlier today.

CLINICAL DATA: RIGHT-sided numbness is increased; recent admission
for stroke.

EXAM:
MRI HEAD WITHOUT CONTRAST
TECHNIQUE: Multiplanar, multiecho pulse sequences of the brain and surrounding
structures were obtained without intravenous contrast.

[Series 5: DWI · axial · 4.0mm · 0.92mm/px · z∈[-92,+60]mm · 8 of 80 slices shown (1 of 4)]
[im 1/80]
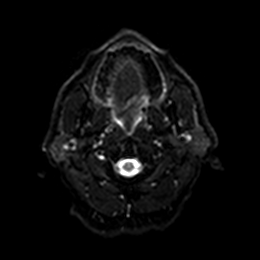
[im 12/80]
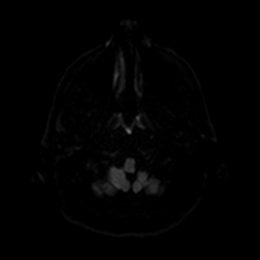
[im 23/80]
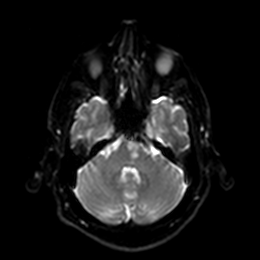
[im 34/80]
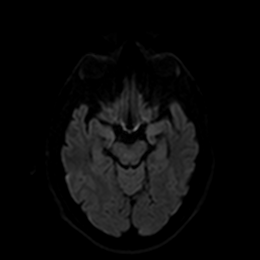
[im 46/80]
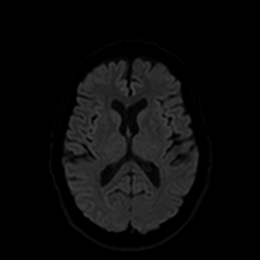
[im 57/80]
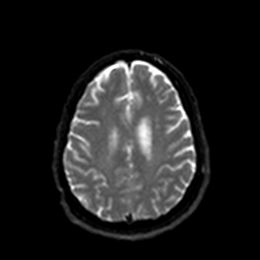
[im 68/80]
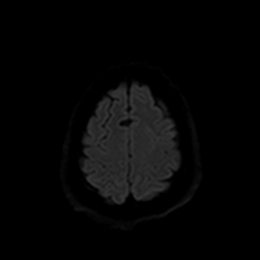
[im 80/80]
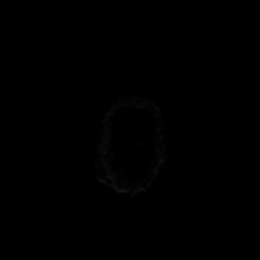

[Series 6: DWI · axial · 4.0mm · 0.92mm/px · z∈[-92,+60]mm · 4 of 40 slices shown (2 of 4)]
[im 1/40]
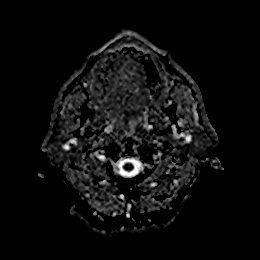
[im 14/40]
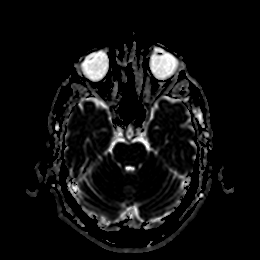
[im 27/40]
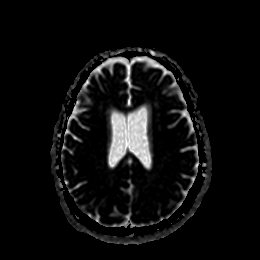
[im 40/40]
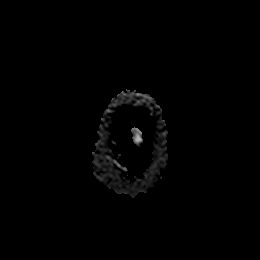

[Series 7: DWI · coronal · 4.0mm · 0.88mm/px · 7 of 66 slices shown (3 of 4)]
[im 1/66]
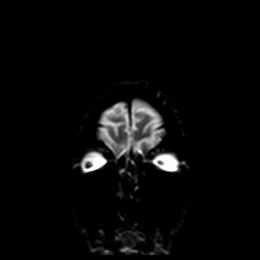
[im 11/66]
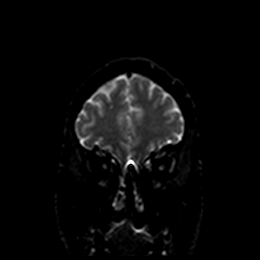
[im 22/66]
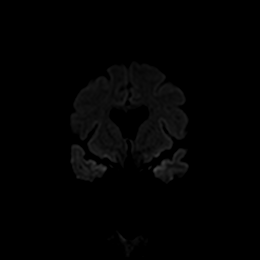
[im 33/66]
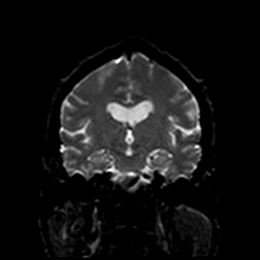
[im 44/66]
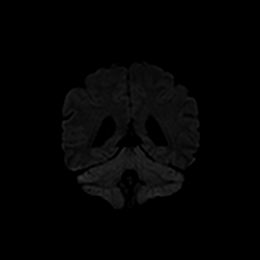
[im 55/66]
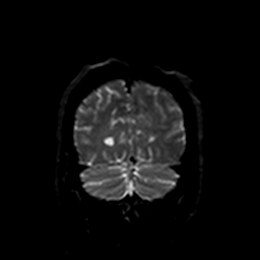
[im 66/66]
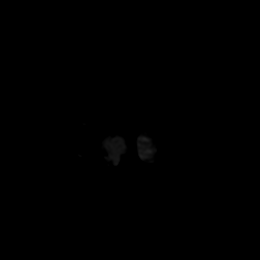

[Series 8: DWI · coronal · 4.0mm · 0.88mm/px · 3 of 33 slices shown (4 of 4)]
[im 1/33]
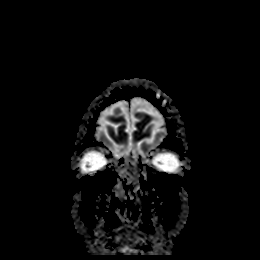
[im 17/33]
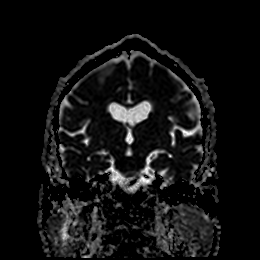
[im 33/33]
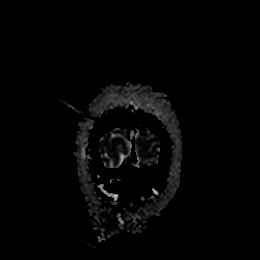

[Series 9: T2 · axial · 5.0mm · 0.75mm/px · z∈[-83,+58]mm · 3 of 25 slices shown (1 of 2)]
[im 1/25]
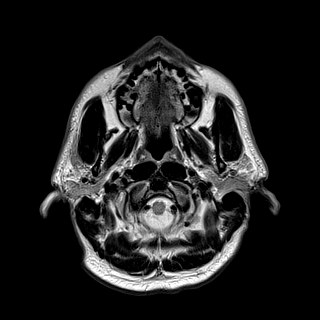
[im 13/25]
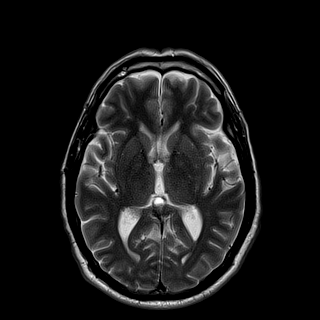
[im 25/25]
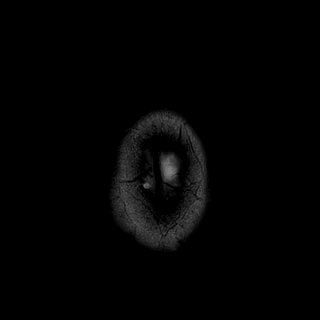

[Series 10: FLAIR · axial · 5.0mm · 0.47mm/px · z∈[-82,+59]mm · 3 of 25 slices shown]
[im 1/25]
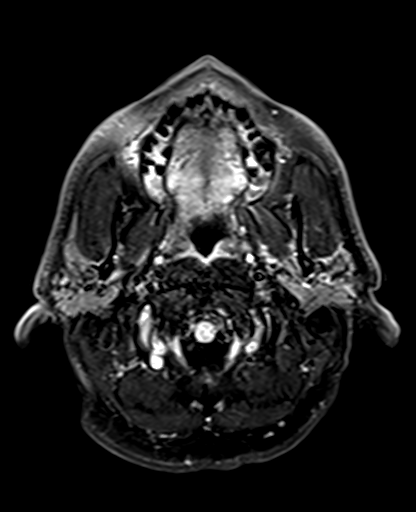
[im 13/25]
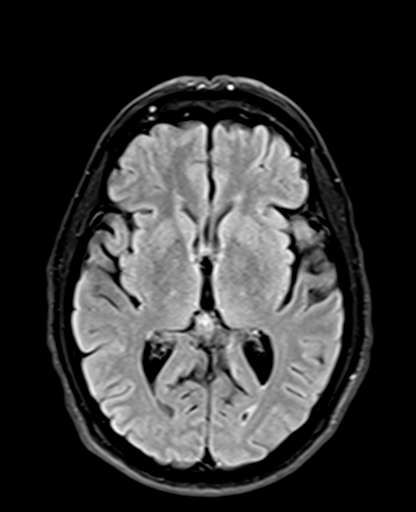
[im 25/25]
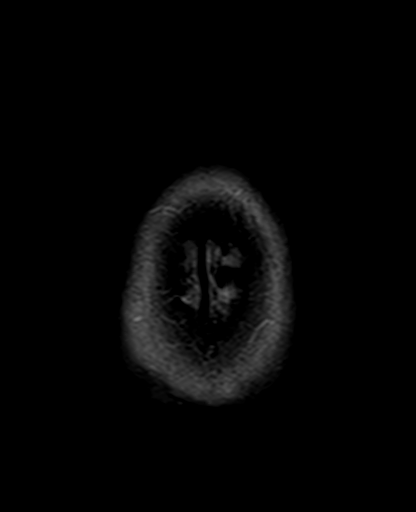

[Series 11: swi_images · axial · 3.0mm · 0.94mm/px · z∈[-90,+60]mm · 5 of 52 slices shown]
[im 1/52]
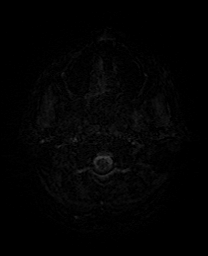
[im 13/52]
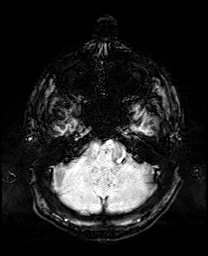
[im 26/52]
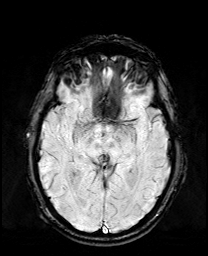
[im 39/52]
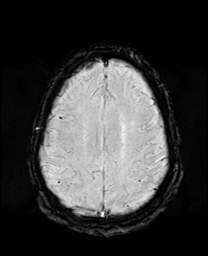
[im 52/52]
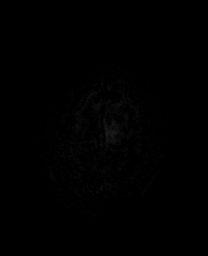

[Series 12: mip_images(sw) · axial · 24.0mm · 0.94mm/px · z∈[-80,+50]mm · 5 of 45 slices shown]
[im 1/45]
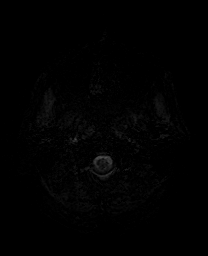
[im 12/45]
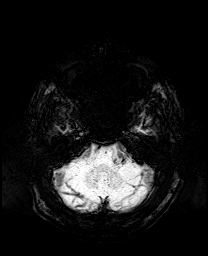
[im 23/45]
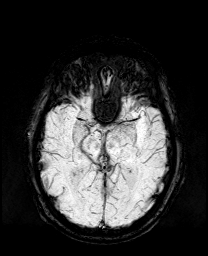
[im 34/45]
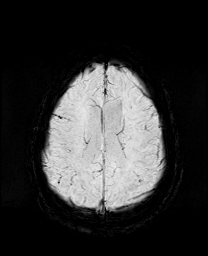
[im 45/45]
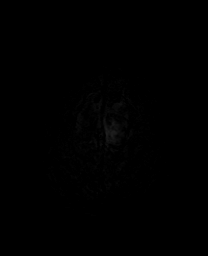

[Series 14: T1 · sagittal · 5.0mm · 0.75mm/px · 2 of 23 slices shown]
[im 1/23]
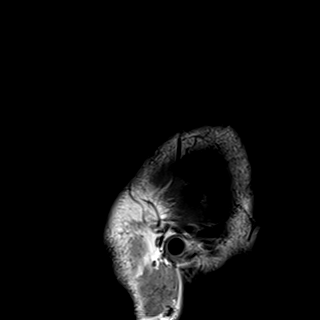
[im 23/23]
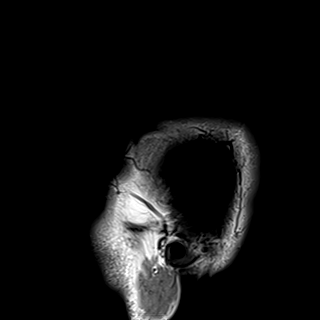

[Series 15: T2 · coronal · 5.0mm · 0.34mm/px · 3 of 29 slices shown (2 of 2)]
[im 1/29]
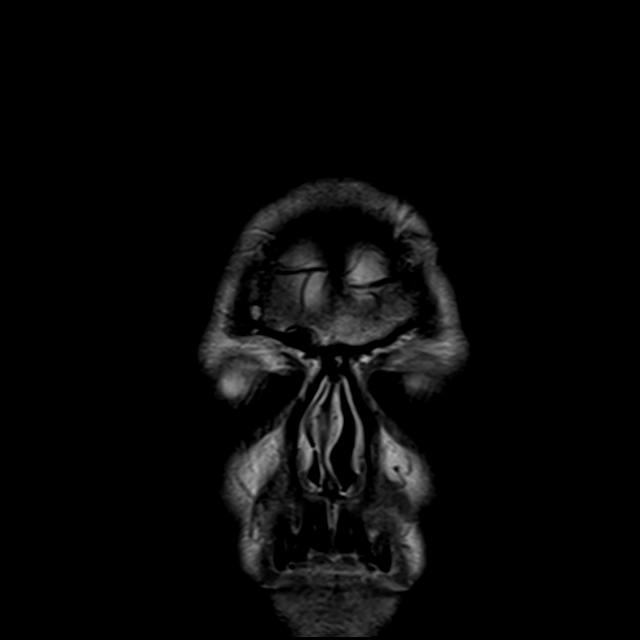
[im 15/29]
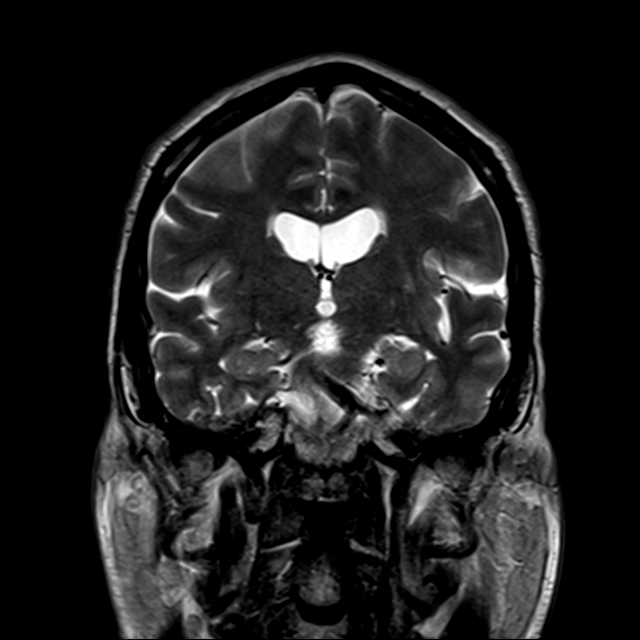
[im 29/29]
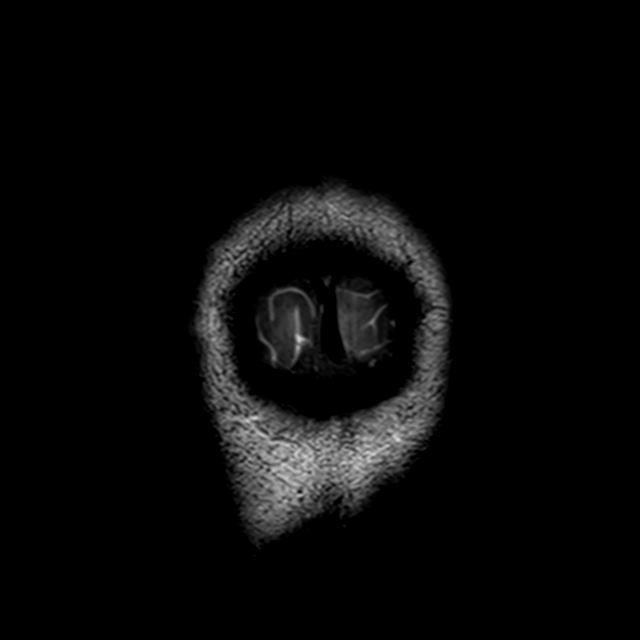

[43 of 48 positions shown; findings below may reference images not displayed]

MR head 11/18/2017. MRA head
11/18/2017. MRA extracranial circulation 11/18/2017.
FINDINGS: Brain: Progression nonhemorrhagic ischemic infarction, compared with
previous MR from 11/18/2017. There are new areas of confluent
restricted diffusion in the basis pontis, now with contralateral
involvement of the RIGHT paramedian pons. There is also dorsal
extension into the midportion of the LEFT pons, with a new area of
restriction extending into the LEFT pontine tegmentum.

No involvement of the midbrain or medulla. No new areas of
cerebellar involvement, although small chronic cerebellar infarcts
are present bilaterally.

Mild premature for age cerebral and cerebellar atrophy. Mild
subcortical and periventricular T2 and FLAIR hyperintensities,
likely chronic microvascular ischemic change.

Vascular: Flow voids are maintained, in the carotid, basilar, and
both vertebral arteries. The basilar is markedly dolichoectatic.
Distal LEFT vertebral stenosis better visualized on prior MRA.

Skull and upper cervical spine: Unremarkable skull base.

Sinuses/Orbits: No pertinent findings.

Other: None.
IMPRESSION: Progression of nonhemorrhagic brainstem infarction involving
primarily the LEFT pons, with both new areas of dorsal ipsilateral
extension to the LEFT pontine tegmentum, as well as contralateral
extension to the RIGHT ventral pons.

CTA head neck recommended for further evaluation, to more accurately
quantify the degree of LEFT V4 segment vertebral stenosis, and
exclude a developing basilar dissection.

## 2020-04-10 ENCOUNTER — Other Ambulatory Visit: Payer: Self-pay | Admitting: Interventional Cardiology

## 2020-08-11 ENCOUNTER — Other Ambulatory Visit: Payer: Self-pay | Admitting: Interventional Cardiology

## 2020-08-24 ENCOUNTER — Emergency Department (HOSPITAL_COMMUNITY): Payer: BLUE CROSS/BLUE SHIELD

## 2020-08-24 ENCOUNTER — Emergency Department (HOSPITAL_COMMUNITY)
Admission: EM | Admit: 2020-08-24 | Discharge: 2020-08-24 | Disposition: A | Discharge status: Home / Self Care | Payer: BLUE CROSS/BLUE SHIELD | Attending: Emergency Medicine | Admitting: Emergency Medicine

## 2020-08-24 ENCOUNTER — Encounter (HOSPITAL_COMMUNITY): Payer: Self-pay | Admitting: Emergency Medicine

## 2020-08-24 DIAGNOSIS — Z7982 Long term (current) use of aspirin: Secondary | ICD-10-CM | POA: Insufficient documentation

## 2020-08-24 DIAGNOSIS — R220 Localized swelling, mass and lump, head: Secondary | ICD-10-CM | POA: Insufficient documentation

## 2020-08-24 DIAGNOSIS — F17210 Nicotine dependence, cigarettes, uncomplicated: Secondary | ICD-10-CM | POA: Insufficient documentation

## 2020-08-24 DIAGNOSIS — R519 Headache, unspecified: Secondary | ICD-10-CM | POA: Insufficient documentation

## 2020-08-24 DIAGNOSIS — Z7902 Long term (current) use of antithrombotics/antiplatelets: Secondary | ICD-10-CM | POA: Insufficient documentation

## 2020-08-24 DIAGNOSIS — I2510 Atherosclerotic heart disease of native coronary artery without angina pectoris: Secondary | ICD-10-CM | POA: Insufficient documentation

## 2020-08-24 DIAGNOSIS — Z79899 Other long term (current) drug therapy: Secondary | ICD-10-CM | POA: Diagnosis not present

## 2020-08-24 DIAGNOSIS — I10 Essential (primary) hypertension: Secondary | ICD-10-CM | POA: Insufficient documentation

## 2020-08-24 DIAGNOSIS — E119 Type 2 diabetes mellitus without complications: Secondary | ICD-10-CM | POA: Diagnosis not present

## 2020-08-24 DIAGNOSIS — Z955 Presence of coronary angioplasty implant and graft: Secondary | ICD-10-CM | POA: Diagnosis not present

## 2020-08-24 DIAGNOSIS — Z7984 Long term (current) use of oral hypoglycemic drugs: Secondary | ICD-10-CM | POA: Diagnosis not present

## 2020-08-24 LAB — CBC WITH DIFFERENTIAL/PLATELET
Abs Immature Granulocytes: 0.06 10*3/uL (ref 0.00–0.07)
Basophils Absolute: 0.1 10*3/uL (ref 0.0–0.1)
Basophils Relative: 1 %
Eosinophils Absolute: 0.1 10*3/uL (ref 0.0–0.5)
Eosinophils Relative: 1 %
HCT: 47.8 % (ref 39.0–52.0)
Hemoglobin: 16 g/dL (ref 13.0–17.0)
Immature Granulocytes: 1 %
Lymphocytes Relative: 20 %
Lymphs Abs: 2.2 10*3/uL (ref 0.7–4.0)
MCH: 30.1 pg (ref 26.0–34.0)
MCHC: 33.5 g/dL (ref 30.0–36.0)
MCV: 89.8 fL (ref 80.0–100.0)
Monocytes Absolute: 1 10*3/uL (ref 0.1–1.0)
Monocytes Relative: 9 %
Neutro Abs: 7.5 10*3/uL (ref 1.7–7.7)
Neutrophils Relative %: 68 %
Platelets: 136 10*3/uL — ABNORMAL LOW (ref 150–400)
RBC: 5.32 MIL/uL (ref 4.22–5.81)
RDW: 13.1 % (ref 11.5–15.5)
WBC: 11 10*3/uL — ABNORMAL HIGH (ref 4.0–10.5)
nRBC: 0 % (ref 0.0–0.2)

## 2020-08-24 LAB — BASIC METABOLIC PANEL
Anion gap: 9 (ref 5–15)
BUN: 10 mg/dL (ref 6–20)
CO2: 25 mmol/L (ref 22–32)
Calcium: 8.8 mg/dL — ABNORMAL LOW (ref 8.9–10.3)
Chloride: 105 mmol/L (ref 98–111)
Creatinine, Ser: 0.89 mg/dL (ref 0.61–1.24)
GFR, Estimated: >60 mL/min (ref >60)
Glucose, Bld: 231 mg/dL — ABNORMAL HIGH (ref 70–99)
Potassium: 3.5 mmol/L (ref 3.5–5.1)
Sodium: 139 mmol/L (ref 135–145)

## 2020-08-24 MED ORDER — CLINDAMYCIN PHOSPHATE 600 MG/50ML IV SOLN
600.0000 mg | Freq: Once | INTRAVENOUS | Status: AC
  Administered 2020-08-24: 600 mg via INTRAVENOUS
  Filled 2020-08-24: qty 50

## 2020-08-24 MED ORDER — IOHEXOL 300 MG/ML  SOLN
75.0000 mL | Freq: Once | INTRAMUSCULAR | Status: AC | PRN
  Administered 2020-08-24: 75 mL via INTRAVENOUS

## 2020-08-24 MED ORDER — HYDROMORPHONE HCL 1 MG/ML IJ SOLN
0.5000 mg | Freq: Once | INTRAMUSCULAR | Status: AC
  Administered 2020-08-24: 0.5 mg via INTRAVENOUS
  Filled 2020-08-24: qty 1

## 2020-08-24 MED ORDER — CLINDAMYCIN HCL 300 MG PO CAPS: 300.0000 mg | ORAL_CAPSULE | Freq: Three times a day (TID) | ORAL | 0 refills | Status: AC

## 2020-08-24 NOTE — ED Notes (Signed)
Patient transported to CT 

## 2020-08-24 NOTE — ED Notes (Signed)
0.5mg  dilaudid wasted by Kindall Swaby RN, witnessed by [employer] RN

## 2020-08-24 NOTE — Discharge Instructions (Signed)
There potentially could be an infection in her mouth coming from your tooth.  We will be treating with antibiotics.  Follow with ENT and the dentist as planned.  Take the antibiotics.  If swelling worsens you can return to the ER

## 2020-08-24 NOTE — ED Provider Notes (Signed)
Emergency Medicine Provider Triage Evaluation Note  Matthew Walker, a 55 y.o. male evaluated in triage.  Pt complains of left neck swelling and pain. Initially began 3 weeks ago, pain in face and upper teeth, now moved to submandibular area. Pain under tongue.   BP (!) 136/98 (BP Location: Right Arm)   Pulse 88   Temp 98.3 F (36.8 C) (Oral)   Resp 16   SpO2 99%   Patient is alert, no acute distress, normal work of breathing Submandibular swelling left neck, mild. TTP sublingual, no large swelling appreciated. Uvula is midline, no trismus, tolerating secretions. No voice change. No tongue swelling.    Medically screening exam initiated at 2:57 PM. Appropriate orders placed.  ALLAN MINOTTI was informed that the remainder of the evaluation will be completed by another provider, this initial triage assessment does not replace that evaluation, and the importance of remaining in the ED until their evaluation is complete.       Kaiven Vester, [place] N, PA-C 08/24/20 1458    Cathren Laine, MD 08/27/20 [phone]

## 2020-08-24 NOTE — ED Notes (Signed)
ED RN reviewed discharge instructions w/ pt. Follow up and medications reviewed, pt had no further questions.

## 2020-08-24 NOTE — ED Triage Notes (Signed)
C/o pain to L side of head/face x 3 weeks.  Seen at [facility] on Friday and was told he had nerve pain.  Reports sore tongue and swelling to L side of neck since yesterday.  Denies fever and chills.

## 2020-09-01 ENCOUNTER — Other Ambulatory Visit: Payer: Self-pay | Admitting: Interventional Cardiology

## 2020-09-01 NOTE — ED Provider Notes (Signed)
[facility] EMERGENCY DEPARTMENT Provider Note   CSN: [id number] Arrival date & time: 08/24/20  1310     History Chief Complaint  Patient presents with  . Headache  . neck swelling    Matthew Walker is a 55 y.o. male.  HPI Patient presents with left-sided facial pain and swelling.  Has had for around 3 weeks now.  Has been on antibiotics for possible dental pain.  Has seen 1 dentist and is reportedly going to see another dentist for potential "microfracture.  No relief with antibiotics previously.  Was seen in another ER a couple days ago and had CT scan that was reassuring.  Now states he has more swelling left side of his mouth.  Had been diagnosed with trigeminal neuralgia.  No difficulty swallowing.  Does feel the fullness in his mouth now which was not there previously.    Past Medical History:  Diagnosis Date  . Arthritis    "knees, hands" (11/18/2017)  . Coronary artery disease   . GERD (gastroesophageal reflux disease)   . High cholesterol   . Hypertension   . Sleep apnea    no CPAP  . STEMI (ST elevation myocardial infarction) (HCC) 07/30/2017   PCI/DES x1 to mLAD with staged intervention to the pRCA, and Lcx. Normal EF  . Stroke (HCC) 11/18/2017   Acute/subacute left pontine CVA/notes 11/18/2017  . Type II diabetes mellitus [facility])     Patient Active Problem List   Diagnosis Date Noted  . History of CVA (cerebrovascular accident) 06/29/2018  . Pre-operative cardiovascular examination 06/29/2018  . Tobacco use 08/02/2017  . CAD- S/P PCI   . NIDDM-type 2 07/30/2017  . Hypertension, essential 07/30/2017  . Dyslipidemia, goal LDL below 70 07/30/2017  . History of STEMI 07/30/2017    Past Surgical History:  Procedure Laterality Date  . CORONARY STENT INTERVENTION N/A 07/30/2017   Procedure: CORONARY STENT INTERVENTION;  Surgeon: Lyn Records, MD;  Location: [facility] INVASIVE CV LAB;  Service: Cardiovascular;  Laterality: N/A;  . CORONARY STENT  INTERVENTION N/A 08/01/2017   Procedure: CORONARY STENT INTERVENTION;  Surgeon: Marykay Lex, MD;  Location: [facility] INVASIVE CV LAB;  Service: Cardiovascular;  Laterality: N/A;  . CORONARY/GRAFT ACUTE MI REVASCULARIZATION N/A 07/30/2017   Procedure: Coronary/Graft Acute MI Revascularization;  Surgeon: Lyn Records, MD;  Location: [facility] INVASIVE CV LAB;  Service: Cardiovascular;  Laterality: N/A;  . INGUINAL HERNIA REPAIR Bilateral   . KNEE ARTHROSCOPY Right   . LEFT HEART CATH AND CORONARY ANGIOGRAPHY N/A 07/30/2017   Procedure: LEFT HEART CATH AND CORONARY ANGIOGRAPHY;  Surgeon: Lyn Records, MD;  Location: [facility] INVASIVE CV LAB;  Service: Cardiovascular;  Laterality: N/A;  . LEFT HEART CATH AND CORONARY ANGIOGRAPHY N/A 08/01/2017   Procedure: LEFT HEART CATH AND CORONARY ANGIOGRAPHY;  Surgeon: Marykay Lex, MD;  Location: [facility] INVASIVE CV LAB;  Service: Cardiovascular;  Laterality: N/A;  . STRABISMUS SURGERY Left 11/24/2018   Procedure: STRABISMUS REPAIR LEFT EYE;  Surgeon: Verne Carrow, MD;  Location: [facility];  Service: Ophthalmology;  Laterality: Left;       No family history on file.  Social History   Tobacco Use  . Smoking status: Current Every Day Smoker    Packs/day: 0.00    Years: 36.00    Pack years: 0.00    Types: Cigarettes    Last attempt to quit: 07/29/2017    Years since quitting: 3.0  . Smokeless tobacco: Former Neurosurgeon  Types: Chew  . Tobacco comment: smoke 2-3/day  Vaping Use  . Vaping Use: Never used  Substance Use Topics  . Alcohol use: Not Currently  . Drug use: Not Currently    Home Medications Prior to Admission medications   Medication Sig Start Date End Date Taking? Authorizing Provider  amLODipine (NORVASC) 10 MG tablet Take 10 mg by mouth daily.    [provider]  aspirin 325 MG tablet Take 1 tablet (325 mg total) by mouth daily. 11/20/17   Alwyn Ren, MD  atorvastatin (LIPITOR) 80 MG tablet TAKE ONE TABLET BY MOUTH  DAILY AT 6 PM. 11/08/17   Bhagat, Sharrell Ku, PA  clopidogrel (PLAVIX) 75 MG tablet Take 1 tablet (75 mg total) by mouth daily. Pt needs to make appt with provider before further refills - 2nd attempt 08/11/20   Lyn Records, MD  fexofenadine (ALLEGRA) 60 MG tablet Take 60 mg by mouth daily as needed for allergies or rhinitis.    [provider]  fluticasone (FLONASE) 50 MCG/ACT nasal spray Place 1 spray into both nostrils as needed for congestion. 07/25/17   [provider]  glipiZIDE (GLUCOTROL) 5 MG tablet Take 1 tablet (5 mg total) by mouth 2 (two) times daily. 11/19/17 11/19/18  Alwyn Ren, MD  lisinopril-hydrochlorothiazide (PRINZIDE,ZESTORETIC) 20-25 MG tablet Take 1 tablet by mouth daily. 08/30/17   Arty Baumgartner, NP  metFORMIN (GLUCOPHAGE-XR) 500 MG 24 hr tablet Take 500 mg by mouth 2 (two) times daily.  07/18/17   [provider]  metoprolol tartrate (LOPRESSOR) 50 MG tablet Take 50 mg by mouth 2 (two) times daily. 01/17/18   [provider]  Multiple Vitamins-Minerals (EMERGEN-C IMMUNE PO) Take 1 packet by mouth daily.    [provider]  nitroGLYCERIN (NITROSTAT) 0.4 MG SL tablet Place 1 tablet (0.4 mg total) under the tongue every 5 (five) minutes as needed for chest pain. 06/29/18 09/27/18  Abelino Derrick, PA-C  sodium chloride (OCEAN) 0.65 % SOLN nasal spray Place 1 spray into both nostrils as needed for congestion.    [provider]    Allergies    Bee venom  Review of Systems   Review of Systems  Constitutional: Negative for appetite change.  HENT: Positive for facial swelling. Negative for mouth sores, sinus pain, sore throat and voice change.   Respiratory: Negative for shortness of breath.   Cardiovascular: Negative for chest pain.  Gastrointestinal: Negative for abdominal pain.  Musculoskeletal: Negative for back pain.  Skin: Negative for rash.  Neurological: Negative for weakness.  Psychiatric/Behavioral:  Negative for behavioral problems.    Physical Exam Updated Vital Signs BP (!) 154/99 (BP Location: Left Arm)   Pulse 98   Temp 98.3 F (36.8 C) (Oral)   Resp 16   SpO2 95%   Physical Exam Vitals reviewed.  HENT:     Mouth/Throat:     Comments: Edema of the left submandibular area.  No stone seen.  No firm mass.  Some tenderness left side of face.  Some tenderness over mandibular teeth. Cardiovascular:     Rate and Rhythm: Normal rate.  Pulmonary:     Effort: Pulmonary effort is normal.  Abdominal:     General: Bowel sounds are normal.  Musculoskeletal:     Cervical back: Neck supple.  Skin:    Coloration: Skin is not cyanotic.  Neurological:     Mental Status: He is alert.  Psychiatric:        Mood and  Affect: Mood normal.     ED Results / Procedures / Treatments   Labs (all labs ordered are listed, but only abnormal results are displayed) Labs Reviewed  CBC WITH DIFFERENTIAL/PLATELET - Abnormal; Notable for the following components:      Result Value   WBC 11.0 (*)    Platelets 136 (*)    All other components within normal limits  BASIC METABOLIC PANEL - Abnormal; Notable for the following components:   Glucose, Bld 231 (*)    Calcium 8.8 (*)    All other components within normal limits    EKG None  Radiology No results found.  Procedures Procedures   Medications Ordered in ED Medications  iohexol (OMNIPAQUE) 300 MG/ML solution 75 mL (75 mLs Intravenous Contrast Given 08/24/20 1752)  clindamycin (CLEOCIN) IVPB 600 mg (0 mg Intravenous Stopped 08/24/20 2050)  HYDROmorphone (DILAUDID) injection 0.5 mg (0.5 mg Intravenous Given 08/24/20 2002)    ED Course  I have reviewed the triage vital signs and the nursing notes.  Pertinent labs & imaging results that were available during my care of the patient were reviewed by me and considered in my medical decision making (see chart for details).    MDM Rules/Calculators/A&P                          Patient  with swelling of floor mouth.  Has had pain for weeks now on his face but CT scan 2 days ago did not show the swelling.  CT scan done and showed submandibular swelling.  No stone.  Discussed with Dr. Jearld Fenton.  Thinks that potentially still could be related to dental cause.  White count only mildly evaded with no fever.  Will give clindamycin.  Feeling somewhat better after treatment.  Eager to go home.  Discharge home with outpatient follow-up.  Return to the ER for worsening symptoms. Final Clinical Impression(s) / ED Diagnoses Final diagnoses:  Mouth swelling    Rx / DC Orders ED Discharge Orders         Ordered    clindamycin (CLEOCIN) 300 MG capsule  3 times daily        08/24/20 2111           Benjiman Core, MD 09/01/20 0730

## 2020-09-02 NOTE — Telephone Encounter (Signed)
That's fine.  Just put the note on there about keeping appt for further refills.

## 2020-12-24 NOTE — Progress Notes (Signed)
Cardiology Office Note:    Date:  12/26/2020   ID:  Matthew Walker, DOB January 14, 1966, MRN [id number]  PCP:  Estanislado Pandy, MD  Cardiologist:  Lesleigh Noe, MD   Referring MD: Estanislado Pandy, MD   Chief Complaint  Patient presents with   Coronary Artery Disease   Follow-up    cva    History of Present Illness:    Matthew Walker is a 55 y.o. male with a hx of HTN, DM II, CVA 10/2017 (pontine lacune), HLD and CAD with inferior STEMI 07/2017, presents for follow up.   I have not seen Matthew Walker since he had a stroke in July 2019.  He has greatly recovered from the stroke.  He was left with right-sided numbness and also had double vision.  This is greatly improved.  He had surgery on his left eye to correct chronic double vision.  Cardiac wise he is doing well.  He gets greater than 7000 steps per day at his job.  His job requires walking, pushing, and lifting.  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  No angina or nitroglycerin use.  Denies dyspnea.  No palpitations or syncope.  Past Medical History:  Diagnosis Date   Arthritis    "knees, hands" (11/18/2017)   Coronary artery disease    GERD (gastroesophageal reflux disease)    High cholesterol    Hypertension    Sleep apnea    no CPAP   STEMI (ST elevation myocardial infarction) (HCC) 07/30/2017   PCI/DES x1 to mLAD with staged intervention to the pRCA, and Lcx. Normal EF   Stroke (HCC) 11/18/2017   Acute/subacute left pontine CVA/notes 11/18/2017   Type II diabetes mellitus (HCC)     Past Surgical History:  Procedure Laterality Date   CORONARY STENT INTERVENTION N/A 07/30/2017   Procedure: CORONARY STENT INTERVENTION;  Surgeon: Lyn Records, MD;  Location: [facility];  Service: Cardiovascular;  Laterality: N/A;   CORONARY STENT INTERVENTION N/A 08/01/2017   Procedure: CORONARY STENT INTERVENTION;  Surgeon: Marykay Lex, MD;  Location: [facility]VASIVE CV LAB;  Service: Cardiovascular;  Laterality: N/A;   CORONARY/GRAFT ACUTE MI  REVASCULARIZATION N/A 07/30/2017   Procedure: Coronary/Graft Acute MI Revascularization;  Surgeon: Lyn Records, MD;  Location: [facility];  Service: Cardiovascular;  Laterality: N/A;   INGUINAL HERNIA REPAIR Bilateral    KNEE ARTHROSCOPY Right    LEFT HEART CATH AND CORONARY ANGIOGRAPHY N/A 07/30/2017   Procedure: LEFT HEART CATH AND CORONARY ANGIOGRAPHY;  Surgeon: Lyn Records, MD;  Location: [facility];  Service: Cardiovascular;  Laterality: N/A;   LEFT HEART CATH AND CORONARY ANGIOGRAPHY N/A 08/01/2017   Procedure: LEFT HEART CATH AND CORONARY ANGIOGRAPHY;  Surgeon: Marykay Lex, MD;  Location: [facility] INVASIVE CV LAB;  Service: Cardiovascular;  Laterality: N/A;   STRABISMUS SURGERY Left 11/24/2018   Procedure: STRABISMUS REPAIR LEFT EYE;  Surgeon: Verne Carrow, MD;  Location: [facility];  Service: Ophthalmology;  Laterality: Left;    Current Medications: Current Meds  Medication Sig   amLODipine (NORVASC) 10 MG tablet Take 10 mg by mouth daily.   aspirin 325 MG tablet Take 1 tablet (325 mg total) by mouth daily.   atorvastatin (LIPITOR) 80 MG tablet TAKE ONE TABLET BY MOUTH DAILY AT 6 PM.   clopidogrel (PLAVIX) 75 MG tablet Take 1 tablet (75 mg total) by mouth daily. Please keep upcoming appointment In September for future refills.  fexofenadine (ALLEGRA) 60 MG tablet Take 60 mg by mouth daily as needed for allergies or rhinitis.   fluticasone (FLONASE) 50 MCG/ACT nasal spray Place 1 spray into both nostrils as needed for congestion.   lisinopril-hydrochlorothiazide (PRINZIDE,ZESTORETIC) 20-25 MG tablet Take 1 tablet by mouth daily.   metFORMIN (GLUCOPHAGE-XR) 500 MG 24 hr tablet Take 500 mg by mouth in the morning, at noon, in the evening, and at bedtime.   metoprolol tartrate (LOPRESSOR) 50 MG tablet Take 50 mg by mouth 2 (two) times daily.   Multiple Vitamins-Minerals (EMERGEN-C IMMUNE PO) Take 1 packet by mouth daily.   sodium chloride (OCEAN) 0.65 %  SOLN nasal spray Place 1 spray into both nostrils as needed for congestion.   traMADol (ULTRAM) 50 MG tablet Take 50 mg by mouth 4 (four) times daily as needed.     Allergies:   Bee venom   Social History   Socioeconomic History   Marital status: Married    Spouse name: Not on file   Number of children: Not on file   Years of education: Not on file   Highest education level: Not on file  Occupational History   Not on file  Tobacco Use   Smoking status: Every Day    Packs/day: 0.00    Years: 36.00    Pack years: 0.00    Types: Cigarettes    Last attempt to quit: 07/29/2017    Years since quitting: 3.4   Smokeless tobacco: Former    Types: Chew   Tobacco comments:    smoke 2-3/day  Vaping Use   Vaping Use: Never used  Substance and Sexual Activity   Alcohol use: Not Currently   Drug use: Not Currently   Sexual activity: Not on file  Other Topics Concern   Not on file  Social History Narrative   Not on file   Social Determinants of Health   Financial Resource Strain: Not on file  Food Insecurity: Not on file  Transportation Needs: Not on file  Physical Activity: Not on file  Stress: Not on file  Social Connections: Not on file     Family History: The patient's family history is not on file.  ROS:   Please see the history of present illness.    Still has some facial numbness.  Occasional sharp short duration migratory chest pains.  All other systems reviewed and are negative.  EKGs/Labs/Other Studies Reviewed:    The following studies were reviewed today:  Cardiac cath/PCI April 2019: Coronary Diagrams  Diagnostic Dominance: Right Intervention   EKG:  EKG sinus rhythm, inferior Q waves and poor R wave progression V1 through V4.  Interventricular conduction delay.  Compared to the prior tracing performed June 29, 2018, changes noted  Recent Labs: 08/24/2020: BUN 10; Creatinine, Ser 0.89; Hemoglobin 16.0; Platelets 136; Potassium 3.5; Sodium 139  Recent  Lipid Panel    Component Value Date/Time   CHOL 142 11/19/2017 0407   TRIG 273 (H) 11/19/2017 0407   HDL 34 (L) 11/19/2017 0407   CHOLHDL 4.2 11/19/2017 0407   VLDL 55 (H) 11/19/2017 0407   LDLCALC 53 11/19/2017 0407    Physical Exam:    VS:  BP (!) 126/92   Pulse 61   Ht 5\' 10"  (1.778 m)   Wt 179 lb (81.2 kg)   BMI 25.68 kg/m     Wt Readings from Last 3 Encounters:  12/26/20 179 lb (81.2 kg)  11/24/18 197 lb 8.5 oz (89.6 kg)  06/29/18 193  lb (87.5 kg)     GEN: Healthy appearing. No acute distress HEENT: Normal NECK: No JVD. LYMPHATICS: No lymphadenopathy CARDIAC: No murmur. RRR no gallop, or edema. VASCULAR:  Normal Pulses. No bruits. RESPIRATORY:  Clear to auscultation without rales, wheezing or rhonchi  ABDOMEN: Soft, non-tender, non-distended, No pulsatile mass, MUSCULOSKELETAL: No deformity  SKIN: Warm and dry NEUROLOGIC:  Alert and oriented x 3 PSYCHIATRIC:  Normal affect   ASSESSMENT:    1. CAD- S/P PCI   2. Hypertension, essential   3. Dyslipidemia, goal LDL below 70   4. NIDDM-type 2   5. History of STEMI   6. Tobacco use   7. History of CVA (cerebrovascular accident)    PLAN:    In order of problems listed above:  Secondary prevention discussed.  Greater than 150 minutes of moderate activity per week is encouraged. Target blood pressure 130/80 mmHg or less.  Low-salt diet.  Continue current therapy including Zestoretic and metoprolol tartrate. Continue high intensity statin therapy with Lipitor 80 mg/day.  Most recent LDL was 61 in March 2022. He needs an SGLT2 to give additional coverage of A1c and and provide cardiac protection in this high risk patient with vascular disease.  I will recommend Jardiance or Farxiga 10 mg/day. No recurrent acute issues. Has discontinued tobacco use. Still has residual from CVA but is fully functional and back at work which is relatively physical.  Overall education and awareness concerning secondary risk  prevention was discussed in detail: LDL less than 70, hemoglobin A1c less than 7, blood pressure target less than 130/80 mmHg, >150 minutes of moderate aerobic activity per week, avoidance of smoking, weight control (via diet and exercise), and continued surveillance/management of/for obstructive sleep apnea.    Medication Adjustments/Labs and Tests Ordered: Current medicines are reviewed at length with the patient today.  Concerns regarding medicines are outlined above.  Orders Placed This Encounter  Procedures   EKG 12-Lead   No orders of the defined types were placed in this encounter.   There are no Patient Instructions on file for this visit.   Signed, Lesleigh Noe, MD  12/26/2020 8:52 AM    [facility]

## 2020-12-26 ENCOUNTER — Encounter: Payer: Self-pay | Admitting: Interventional Cardiology

## 2020-12-26 ENCOUNTER — Encounter (INDEPENDENT_AMBULATORY_CARE_PROVIDER_SITE_OTHER): Payer: Self-pay

## 2020-12-26 ENCOUNTER — Other Ambulatory Visit: Payer: Self-pay

## 2020-12-26 ENCOUNTER — Ambulatory Visit: Payer: BLUE CROSS/BLUE SHIELD | Admitting: Interventional Cardiology

## 2020-12-26 VITALS — BP 126/92 | HR 61 | Ht 70.0 in | Wt 179.0 lb

## 2020-12-26 DIAGNOSIS — I10 Essential (primary) hypertension: Secondary | ICD-10-CM | POA: Diagnosis not present

## 2020-12-26 DIAGNOSIS — E119 Type 2 diabetes mellitus without complications: Secondary | ICD-10-CM | POA: Diagnosis not present

## 2020-12-26 DIAGNOSIS — Z9861 Coronary angioplasty status: Secondary | ICD-10-CM | POA: Diagnosis not present

## 2020-12-26 DIAGNOSIS — E785 Hyperlipidemia, unspecified: Secondary | ICD-10-CM | POA: Diagnosis not present

## 2020-12-26 DIAGNOSIS — I2510 Atherosclerotic heart disease of native coronary artery without angina pectoris: Secondary | ICD-10-CM

## 2020-12-26 DIAGNOSIS — I252 Old myocardial infarction: Secondary | ICD-10-CM

## 2020-12-26 DIAGNOSIS — Z720 Tobacco use: Secondary | ICD-10-CM

## 2020-12-26 DIAGNOSIS — Z8673 Personal history of transient ischemic attack (TIA), and cerebral infarction without residual deficits: Secondary | ICD-10-CM

## 2020-12-26 NOTE — Patient Instructions (Signed)
Medication Instructions:  1) Ask your Primary Care Physician about starting Jardiance.  This will help with diabetes and the heart.  *If you need a refill on your cardiac medications before your next appointment, please call your pharmacy*   Lab Work: None If you have labs (blood work) drawn today and your tests are completely normal, you will receive your results only by: MyChart Message (if you have MyChart) OR A paper copy in the mail If you have any lab test that is abnormal or we need to change your treatment, we will call you to review the results.   Testing/Procedures: None   Follow-Up: At [facility], you and your health needs are our priority.  As part of our continuing mission to provide you with exceptional heart care, we have created designated Provider Care Teams.  These Care Teams include your primary Cardiologist (physician) and Advanced Practice Providers (APPs -  Physician Assistants and Nurse Practitioners) who all work together to provide you with the care you need, when you need it.  We recommend signing up for the patient portal called "MyChart".  Sign up information is provided on this After Visit Summary.  MyChart is used to connect with patients for Virtual Visits (Telemedicine).  Patients are able to view lab/test results, encounter notes, upcoming appointments, etc.  Non-urgent messages can be sent to your provider as well.   To learn more about what you can do with MyChart, go to [email or web address].    Your next appointment:   1 year(s)  The format for your next appointment:   In Person  Provider:   You may see Lesleigh Noe, MD or one of the following Advanced Practice Providers on your designated Care Team:   Nada Boozer, NP   Other Instructions

## 2021-02-03 ENCOUNTER — Other Ambulatory Visit: Payer: Self-pay | Admitting: Interventional Cardiology

## 2021-09-04 ENCOUNTER — Telehealth: Payer: Self-pay | Admitting: Interventional Cardiology

## 2021-09-04 NOTE — Telephone Encounter (Signed)
Pt c/o medication issue: ? ?1. Name of Medication: Marcelline Deist ? ?2. How are you currently taking this medication (dosage and times per day)?  ? ?3. Are you having a reaction (difficulty breathing--STAT)?  ? ?4. What is your medication issue? Patient's wife called stating patient has blood in his urine.  She wants to know if this is common with this medication.   ?

## 2021-09-04 NOTE — Telephone Encounter (Signed)
Matthew Walker increases the amount of sugar excreted in urine which can lead to infections.  Patient's A1c is elevated (8.4 on 05/21/21) so it is possible he may have developed an infection. Recommend d/c Farxiga. Any other symptoms of UTI or other infections? If so, recommend following up with treatment at urgent care ?

## 2021-09-04 NOTE — Telephone Encounter (Signed)
Spoke with patient's spouse, Gabriel Rung (on [place]). She states patient has blood in his urine and was concerned about Marcelline Deist causing this. ? ?Per Pharm D: ?Farxiga increases the amount of sugar excreted in urine which can lead to infections.  Patient's A1c is elevated (8.4 on 05/21/21) so it is possible he may have developed an infection. Recommend d/c Farxiga. Any other symptoms of UTI or other infections? If so, recommend following up with treatment at urgent care ? ?Discussed the above with Dr. Katrinka Blazing, he states this needs to be further evaluated. If he is not having any other symptoms of UTI then this is not likely from [place]. ? ?Gabriel Rung states patient has a urine culture pending, he had an [place] of kidney/bladder and ruled out kidney stones, he is going for a CT scan of pelvis on Monday. Advised her of Dr. Michaelle Copas recommendation to stay on Farxiga if no other symptoms of UTI and continue with PCP to evaluate blood in urine. ? ?Monique verbalized understanding and expressed appreciation for call. ?

## 2021-09-11 ENCOUNTER — Encounter: Payer: Self-pay | Admitting: Urology

## 2021-09-11 ENCOUNTER — Ambulatory Visit (INDEPENDENT_AMBULATORY_CARE_PROVIDER_SITE_OTHER): Payer: No Typology Code available for payment source | Admitting: Urology

## 2021-09-11 VITALS — BP 153/100 | HR 77 | Wt 173.0 lb

## 2021-09-11 DIAGNOSIS — R310 Gross hematuria: Secondary | ICD-10-CM | POA: Insufficient documentation

## 2021-09-11 LAB — URINALYSIS, ROUTINE W REFLEX MICROSCOPIC
Bilirubin, UA: NEGATIVE
Glucose, UA: NEGATIVE
Leukocytes,UA: NEGATIVE
Nitrite, UA: NEGATIVE
Protein,UA: NEGATIVE
RBC, UA: NEGATIVE
Specific Gravity, UA: 1.02 (ref 1.005–1.030)
Urobilinogen, Ur: 0.2 mg/dL (ref 0.2–1.0)
pH, UA: 6.5 (ref 5.0–7.5)

## 2021-09-11 NOTE — Progress Notes (Signed)
Assessment: 1. Gross hematuria     Plan: Today I had a discussion with the patient regarding the findings of gross hematuria including the implications and differential diagnoses associated with it.  I also discussed recommendations for further evaluation including the rationale for upper tract imaging and cystoscopy.  I discussed the nature of these procedures including potential risk and complications.  The patient expressed an understanding of these issues. Schedule for CT hematuria protocol followed by cystoscopy.   Chief Complaint:  Chief Complaint  Patient presents with   Hematuria    History of Present Illness:  Matthew Walker is a 56 y.o. year old male who is seen in consultation from Estanislado PandySasser, Paul W, MD for evaluation of gross hematuria.  He had a recent episode of gross hematuria approximately 2 weeks ago.  This resolved spontaneously.  He then had a recurrence of the gross hematuria which lasted approximately 3 days.  He did pass some small clots in association with this episode.  He also experienced some intermittent right-sided flank pain which lasted for approximately 2 hours then resolved.  His urine visibly cleared and he passed some small clots.  He has not had any further gross hematuria or passage of clots.  No dysuria associated with this episode.  Urine culture from 09/03/2021 grew <10K colonies. Renal ultrasound from 09/04/2021 showed normal kidneys bilaterally without mass or obstruction, normal-appearing bladder, mildly enlarged prostate.  He denies any significant urinary symptoms at the present time. IPSS = 4 today. He does have a prior history of tobacco use smoking 1 pack/day for 30+ years.  He quit 2 years ago.  Past Medical History:  Past Medical History:  Diagnosis Date   Arthritis    "knees, hands" (11/18/2017)   Coronary artery disease    GERD (gastroesophageal reflux disease)    High cholesterol    Hypertension    Sleep apnea    no CPAP   STEMI (ST  elevation myocardial infarction) (HCC) 07/30/2017   PCI/DES x1 to mLAD with staged intervention to the pRCA, and Lcx. Normal EF   Stroke (HCC) 11/18/2017   Acute/subacute left pontine CVA/notes 11/18/2017   Type II diabetes mellitus (HCC)     Past Surgical History:  Past Surgical History:  Procedure Laterality Date   CORONARY STENT INTERVENTION N/A 07/30/2017   Procedure: CORONARY STENT INTERVENTION;  Surgeon: Lyn RecordsSmith, Henry W, MD;  Location: [facility];  Service: Cardiovascular;  Laterality: N/A;   CORONARY STENT INTERVENTION N/A 08/01/2017   Procedure: CORONARY STENT INTERVENTION;  Surgeon: Marykay LexHarding, David W, MD;  [facility];  Service: Cardiovascular;  Laterality: N/A;   CORONARY/GRAFT ACUTE MI REVASCULARIZATION N/A 07/30/2017   Procedure: Coronary/Graft Acute MI Revascularization;  Surgeon: Lyn RecordsSmith, Henry W, MD;  Location: [facility];  Service: Cardiovascular;  Laterality: N/A;   INGUINAL HERNIA REPAIR Bilateral    KNEE ARTHROSCOPY Right    LEFT HEART CATH AND CORONARY ANGIOGRAPHY N/A 07/30/2017   Procedure: LEFT HEART CATH AND CORONARY ANGIOGRAPHY;  Surgeon: Lyn RecordsSmith, Henry W, MD;  Location: [facility];  Service: Cardiovascular;  Laterality: N/A;   LEFT HEART CATH AND CORONARY ANGIOGRAPHY N/A 08/01/2017   Procedure: LEFT HEART CATH AND CORONARY ANGIOGRAPHY;  Surgeon: Marykay LexHarding, David W, MD;  [facility];  Service: Cardiovascular;  Laterality: N/A;   STRABISMUS SURGERY Left 11/24/2018   Procedure: STRABISMUS REPAIR LEFT EYE;  Surgeon: Verne CarrowYoung, William, MD;  Location: [facility];  Service: Ophthalmology;  Laterality: Left;    Allergies:  Allergies  Allergen Reactions   Bee Venom Other (See Comments)    Hay Fever    Family History:  No family history on file.  Social History:  Social History   Tobacco Use   Smoking status: Every Day    Packs/day: 0.00    Years: 36.00    Pack years: 0.00    Types: Cigarettes    Last  attempt to quit: 07/29/2017    Years since quitting: 4.1   Smokeless tobacco: Former    Types: Chew   Tobacco comments:    smoke 2-3/day  Vaping Use   Vaping Use: Never used  Substance Use Topics   Alcohol use: Not Currently   Drug use: Not Currently    Review of symptoms:  Constitutional:  Negative for unexplained weight loss, night sweats, fever, chills ENT:  Negative for nose bleeds, sinus pain, painful swallowing CV:  Negative for chest pain, shortness of breath, exercise intolerance, palpitations, loss of consciousness Resp:  Negative for cough, wheezing, shortness of breath GI:  Negative for nausea, vomiting, diarrhea, bloody stools GU:  Positives noted in HPI; otherwise negative for dysuria, urinary incontinence Neuro:  Negative for seizures, poor balance, limb weakness, slurred speech Psych:  Negative for lack of energy, depression, anxiety Endocrine:  Negative for polydipsia, polyuria, symptoms of hypoglycemia (dizziness, hunger, sweating) Hematologic:  Negative for anemia, purpura, petechia, prolonged or excessive bleeding, use of anticoagulants  Allergic:  Negative for difficulty breathing or choking as a result of exposure to anything; no shellfish allergy; no allergic response (rash/itch) to materials, foods  Physical exam: BP (!) 153/100   Pulse 77   Wt 173 lb (78.5 kg)   BMI 24.82 kg/m  GENERAL APPEARANCE:  Well appearing, well developed, well nourished, NAD HEENT: Atraumatic, Normocephalic, oropharynx clear. NECK: Supple without lymphadenopathy or thyromegaly. LUNGS: Clear to auscultation bilaterally. HEART: Regular Rate and Rhythm without murmurs, gallops, or rubs. ABDOMEN: Soft, non-tender, No Masses. EXTREMITIES: Moves all extremities well.  Without clubbing, cyanosis, or edema. NEUROLOGIC:  Alert and oriented x 3, normal gait, CN II-XII grossly intact.  MENTAL STATUS:  Appropriate. BACK:  Non-tender to palpation.  No CVAT SKIN:  Warm, dry and intact.    GU: Penis:  circumcised Meatus: Normal Scrotum: normal, no masses Testis: normal without masses bilateral Epididymis: normal Prostate:  declined by patient  Results: U/A dipstick: 3+ glucose, negative blood

## 2021-09-25 ENCOUNTER — Ambulatory Visit (HOSPITAL_BASED_OUTPATIENT_CLINIC_OR_DEPARTMENT_OTHER)
Admission: RE | Admit: 2021-09-25 | Discharge: 2021-09-25 | Disposition: A | Discharge status: Home / Self Care | Payer: No Typology Code available for payment source | Source: Ambulatory Visit | Attending: Urology | Admitting: Urology

## 2021-09-25 DIAGNOSIS — R310 Gross hematuria: Secondary | ICD-10-CM | POA: Diagnosis not present

## 2021-09-25 MED ORDER — IOHEXOL 300 MG/ML  SOLN
100.0000 mL | Freq: Once | INTRAMUSCULAR | Status: AC | PRN
  Administered 2021-09-25: 100 mL via INTRAVENOUS

## 2021-10-13 ENCOUNTER — Ambulatory Visit (INDEPENDENT_AMBULATORY_CARE_PROVIDER_SITE_OTHER): Payer: No Typology Code available for payment source | Admitting: Urology

## 2021-10-13 VITALS — BP 139/92 | HR 78

## 2021-10-13 DIAGNOSIS — N200 Calculus of kidney: Secondary | ICD-10-CM

## 2021-10-13 DIAGNOSIS — R310 Gross hematuria: Secondary | ICD-10-CM | POA: Diagnosis not present

## 2021-10-13 MED ORDER — CIPROFLOXACIN HCL 500 MG PO TABS
500.0000 mg | ORAL_TABLET | Freq: Once | ORAL | Status: AC
  Administered 2021-10-13: 500 mg via ORAL

## 2021-10-13 NOTE — Progress Notes (Signed)
Assessment: 1. Gross hematuria   2. Nephrolithiasis     Plan: I personally reviewed the CT study from 09/25/2021.  I discussed the findings with the patient in detail.  I also discussed the findings of the cystoscopy today. Urine cytology sent today Cipro x 1 following cystoscopy Return to office in 6 weeks Stone prevention discussed and information provided  Chief Complaint:  Chief Complaint  Patient presents with   Hematuria    History of Present Illness:  [name] is a 56 y.o. year old male who is seen for further evaluation of gross hematuria.   He had a recent episode of gross hematuria which resolved spontaneously.  He then had a recurrence of the gross hematuria which lasted approximately 3 days.  He passed some small clots in association with this episode.  He also experienced some intermittent right-sided flank pain which lasted for approximately 2 hours then resolved.  His urine visibly cleared and he passed some small clots.  He has not had any further gross hematuria or passage of clots.  No dysuria associated with this episode.  Urine culture from 09/03/2021 grew <10K colonies. Renal ultrasound from 09/04/2021 showed normal kidneys bilaterally without mass or obstruction, normal-appearing bladder, mildly enlarged prostate.  He denied any significant urinary symptoms. IPSS = 4. He does have a prior history of tobacco use smoking 1 pack/day for 30+ years.  He quit 2 years ago.  CT hematuria protocol from 09/25/21 showed small bilateral non-obstructing renal calculi. No renal mass, obstruction, or filling defects seen.  He presents today for cystoscopy. No further episodes of gross hematuria.  No dysuria or flank pain. IPSS = 3 today.   Portions of the above documentation were copied from a prior visit for review purposes only.   Past Medical History:  Past Medical History:  Diagnosis Date   Arthritis    "knees, hands" (11/18/2017)   Coronary artery disease     GERD (gastroesophageal reflux disease)    High cholesterol    Hypertension    Sleep apnea    no CPAP   STEMI (ST elevation myocardial infarction) (HCC) 07/30/2017   PCI/DES x1 to mLAD with staged intervention to the pRCA, and Lcx. Normal EF   Stroke (HCC) 11/18/2017   Acute/subacute left pontine CVA/notes 11/18/2017   Type II diabetes mellitus (HCC)     Past Surgical History:  Past Surgical History:  Procedure Laterality Date   CORONARY STENT INTERVENTION N/A 07/30/2017   Procedure: CORONARY STENT INTERVENTION;  Surgeon: Lyn Records, MD;  Location: [facility];  Service: Cardiovascular;  Laterality: N/A;   CORONARY STENT INTERVENTION N/A 08/01/2017   Procedure: CORONARY STENT INTERVENTION;  Surgeon: Marykay Lex, MD;  Location: [facility] INVAS[facility] CV LAB;  Service: Cardiovascular;  Laterality: N/A;   CORONARY/GRAFT ACUTE MI REVASCULARIZATION N/A 07/30/2017   Procedure: Coronary/Graft Acute MI Revascularization;  Surgeon: Lyn Records, MD;  Location: [facility];  Service: Cardiovascular;  Laterality: N/A;   INGUINAL HERNIA REPAIR Bilateral    KNEE ARTHROSCOPY Right    LEFT HEART CATH AND CORONARY ANGIOGRAPHY N/A 07/30/2017   Procedure: LEFT HEART CATH AND CORONARY ANGIOGRAPHY;  Surgeon: Lyn Records, MD;  Location: [facility];  Service: Cardiovascular;  Laterality: N/A;   LEFT HEART CATH AND CORONARY ANGIOGRAPHY N/A 08/01/2017   Procedure: LEFT HEART CATH AND CORONARY ANGIOGRAPHY;  Surgeon: Marykay Lex, MD;  Location: [facility] INVASIVE CV LAB;  Service: Cardiovascular;  Laterality: N/A;  STRABISMUS SURGERY Left 11/24/2018   Procedure: STRABISMUS REPAIR LEFT EYE;  Surgeon: Verne Carrow, MD;  Location: [facility];  Service: Ophthalmology;  Laterality: Left;    Allergies:  Allergies  Allergen Reactions   Bee Venom Other (See Comments)    Hay Fever    Family History:  No family history on file.  Social History:  Social History   Tobacco Use    Smoking status: Every Day    Packs/day: 0.00    Years: 36.00    Total pack years: 0.00    Types: Cigarettes    Last attempt to quit: 07/29/2017    Years since quitting: 4.2   Smokeless tobacco: Former    Types: Chew   Tobacco comments:    smoke 2-3/day  Vaping Use   Vaping Use: Never used  Substance Use Topics   Alcohol use: Not Currently   Drug use: Not Currently    ROS: Constitutional:  Negative for fever, chills, weight loss CV: Negative for chest pain, previous MI, hypertension Respiratory:  Negative for shortness of breath, wheezing, sleep apnea, frequent cough GI:  Negative for nausea, vomiting, bloody stool, GERD  Physical exam: BP (!) 139/92   Pulse 78  GENERAL APPEARANCE:  Well appearing, well developed, well nourished, NAD HEENT:  Atraumatic, normocephalic, oropharynx clear NECK:  Supple without lymphadenopathy or thyromegaly ABDOMEN:  Soft, non-tender, no masses EXTREMITIES:  Moves all extremities well, without clubbing, cyanosis, or edema NEUROLOGIC:  Alert and oriented x 3, normal gait, CN II-XII grossly intact MENTAL STATUS:  appropriate BACK:  Non-tender to palpation, No CVAT SKIN:  Warm, dry, and intact  Results: U/A: 3+ glucose   Procedure:  Flexible Cystourethroscopy  Pre-operative Diagnosis: Gross hematuria  Post-operative Diagnosis: Gross hematuria  Anesthesia:  local with lidocaine jelly  Surgical Narrative:  After appropriate informed consent was obtained, the patient was prepped and draped in the usual sterile fashion in the supine position.  The patient was correctly identified and the proper procedure delineated prior to proceeding.  Sterile lidocaine gel was instilled in the urethra. The flexible cystoscope was introduced without difficulty.  Findings:  Anterior urethra: Normal  Posterior urethra: Lateral lobe hypertrophy, increased vascularity in prostatic urethra  Bladder: Normal  Ureteral orifices: normal  Additional findings:  none  Saline bladder wash for cytology was performed.    The cystoscope was then removed.  The patient tolerated the procedure well.

## 2021-10-14 LAB — URINALYSIS, ROUTINE W REFLEX MICROSCOPIC
Bilirubin, UA: NEGATIVE
Ketones, UA: NEGATIVE
Leukocytes,UA: NEGATIVE
Nitrite, UA: NEGATIVE
Protein,UA: NEGATIVE
RBC, UA: NEGATIVE
Specific Gravity, UA: 1.02 (ref 1.005–1.030)
Urobilinogen, Ur: 0.2 mg/dL (ref 0.2–1.0)
pH, UA: 5 (ref 5.0–7.5)

## 2021-10-23 ENCOUNTER — Other Ambulatory Visit: Payer: Self-pay | Admitting: Urology

## 2021-11-24 ENCOUNTER — Ambulatory Visit: Payer: No Typology Code available for payment source | Admitting: Urology

## 2021-11-24 DIAGNOSIS — N200 Calculus of kidney: Secondary | ICD-10-CM

## 2021-11-24 NOTE — Progress Notes (Deleted)
Assessment: 1. Gross hematuria   2. Nephrolithiasis     Plan: Return to office in 6 weeks Stone prevention discussed and information provided  Chief Complaint:  No chief complaint on file.   History of Present Illness:  Matthew Walker is a 56 y.o. year old male who is seen for further evaluation of gross hematuria.   He had a recent episode of gross hematuria which resolved spontaneously.  He then had a recurrence of the gross hematuria which lasted approximately 3 days.  He passed some small clots in association with this episode.  He also experienced some intermittent right-sided flank pain which lasted for approximately 2 hours then resolved.  His urine visibly cleared and he passed some small clots.  He has not had any further gross hematuria or passage of clots.  No dysuria associated with this episode.  Urine culture from 09/03/2021 grew <10K colonies. Renal ultrasound from 09/04/2021 showed normal kidneys bilaterally without mass or obstruction, normal-appearing bladder, mildly enlarged prostate.  He denied any significant urinary symptoms. IPSS = 4. He does have a prior history of tobacco use smoking 1 pack/day for 30+ years.  He quit 2 years ago.  CT hematuria protocol from 09/25/21 showed small bilateral non-obstructing renal calculi. No renal mass, obstruction, or filling defects seen.  He was evaluated with cystoscopy on 10/13/2021.  He was found to have lateral lobe enlargement of the prostate with increased vascularity in the prostatic urethra, no bladder abnormalities.  Urine cytology was negative for malignancy.     Portions of the above documentation were copied from a prior visit for review purposes only.   Past Medical History:  Past Medical History:  Diagnosis Date   Arthritis    "knees, hands" (11/18/2017)   Coronary artery disease    GERD (gastroesophageal reflux disease)    High cholesterol    Hypertension    Sleep apnea    no CPAP   STEMI (ST elevation  myocardial infarction) (HCC) 07/30/2017   PCI/DES x1 to mLAD with staged intervention to the pRCA, and Lcx. Normal EF   Stroke (HCC) 11/18/2017   Acute/subacute left pontine CVA/notes 11/18/2017   Type II diabetes mellitus (HCC)     Past Surgical History:  Past Surgical History:  Procedure Laterality Date   CORONARY STENT INTERVENTION N/A 07/30/2017   Procedure: CORONARY STENT INTERVENTION;  Surgeon: Lyn Records, MD;  Location: [facility];  Service: Cardiovascular;  Laterality: N/A;   CORONARY STENT INTERVENTION N/A 08/01/2017   Procedure: CORONARY STENT INTERVENTION;  Surgeon: Marykay Lex, MD;  Location: [facility] INVASIVE CV LAB;  Service: Cardiovascular;  Laterality: N/A;   CORONARY/GRAFT ACUTE MI REVASCULARIZATION N/A 07/30/2017   Procedure: Coronary/Graft Acute MI Revascularization;  Surgeon: Lyn Records, MD;  Location: [facility];  Service: Cardiovascular;  Laterality: N/A;   INGUINAL HERNIA REPAIR Bilateral    KNEE ARTHROSCOPY Right    LEFT HEART CATH AND CORONARY ANGIOGRAPHY N/A 07/30/2017   Procedure: LEFT HEART CATH AND CORONARY ANGIOGRAPHY;  Surgeon: Lyn Records, MD;  Location: [facility];  Service: Cardiovascular;  Laterality: N/A;   LEFT HEART CATH AND CORONARY ANGIOGRAPHY N/A 08/01/2017   Procedure: LEFT HEART CATH AND CORONARY ANGIOGRAPHY;  Surgeon: Marykay Lex, MD;  Location: [facility] INVASIVE CV LAB;  Service: Cardiovascular;  Laterality: N/A;   STRABISMUS SURGERY Left 11/24/2018   Procedure: STRABISMUS REPAIR LEFT EYE;  Surgeon: Verne Carrow, MD;  Location: [facility];  Service:  Ophthalmology;  Laterality: Left;    Allergies:  Allergies  Allergen Reactions   Bee Venom Other (See Comments)    Hay Fever    Family History:  No family history on file.  Social History:  Social History   Tobacco Use   Smoking status: Every Day    Packs/day: 0.00    Years: 36.00    Total pack years: 0.00    Types: Cigarettes    Last attempt to  quit: 07/29/2017    Years since quitting: 4.3   Smokeless tobacco: Former    Types: Chew   Tobacco comments:    smoke 2-3/day  Vaping Use   Vaping Use: Never used  Substance Use Topics   Alcohol use: Not Currently   Drug use: Not Currently    ROS: Constitutional:  Negative for fever, chills, weight loss CV: Negative for chest pain, previous MI, hypertension Respiratory:  Negative for shortness of breath, wheezing, sleep apnea, frequent cough GI:  Negative for nausea, vomiting, bloody stool, GERD  Physical exam: There were no vitals taken for this visit. GENERAL APPEARANCE:  Well appearing, well developed, well nourished, NAD HEENT:  Atraumatic, normocephalic, oropharynx clear NECK:  Supple without lymphadenopathy or thyromegaly ABDOMEN:  Soft, non-tender, no masses EXTREMITIES:  Moves all extremities well, without clubbing, cyanosis, or edema NEUROLOGIC:  Alert and oriented x 3, normal gait, CN II-XII grossly intact MENTAL STATUS:  appropriate BACK:  Non-tender to palpation, No CVAT SKIN:  Warm, dry, and intact  Results: U/A:

## 2021-12-16 ENCOUNTER — Emergency Department (HOSPITAL_COMMUNITY)
Admission: EM | Admit: 2021-12-16 | Discharge: 2021-12-16 | Disposition: A | Discharge status: Home / Self Care | Payer: No Typology Code available for payment source | Attending: Emergency Medicine | Admitting: Emergency Medicine

## 2021-12-16 ENCOUNTER — Other Ambulatory Visit: Payer: Self-pay

## 2021-12-16 ENCOUNTER — Emergency Department (HOSPITAL_COMMUNITY): Payer: No Typology Code available for payment source

## 2021-12-16 ENCOUNTER — Encounter (HOSPITAL_COMMUNITY): Payer: Self-pay

## 2021-12-16 DIAGNOSIS — M79662 Pain in left lower leg: Secondary | ICD-10-CM | POA: Diagnosis not present

## 2021-12-16 DIAGNOSIS — T1490XA Injury, unspecified, initial encounter: Secondary | ICD-10-CM

## 2021-12-16 DIAGNOSIS — M25552 Pain in left hip: Secondary | ICD-10-CM | POA: Diagnosis not present

## 2021-12-16 DIAGNOSIS — M25572 Pain in left ankle and joints of left foot: Secondary | ICD-10-CM | POA: Diagnosis not present

## 2021-12-16 DIAGNOSIS — M79661 Pain in right lower leg: Secondary | ICD-10-CM | POA: Diagnosis not present

## 2021-12-16 DIAGNOSIS — M25551 Pain in right hip: Secondary | ICD-10-CM | POA: Diagnosis present

## 2021-12-16 DIAGNOSIS — Z23 Encounter for immunization: Secondary | ICD-10-CM | POA: Insufficient documentation

## 2021-12-16 LAB — CBC
HCT: 45.9 % (ref 39.0–52.0)
Hemoglobin: 15.6 g/dL (ref 13.0–17.0)
MCH: 30.3 pg (ref 26.0–34.0)
MCHC: 34 g/dL (ref 30.0–36.0)
MCV: 89.1 fL (ref 80.0–100.0)
Platelets: 148 10*3/uL — ABNORMAL LOW (ref 150–400)
RBC: 5.15 MIL/uL (ref 4.22–5.81)
RDW: 13.4 % (ref 11.5–15.5)
WBC: 13.2 10*3/uL — ABNORMAL HIGH (ref 4.0–10.5)
nRBC: 0 % (ref 0.0–0.2)

## 2021-12-16 LAB — COMPREHENSIVE METABOLIC PANEL
ALT: 18 U/L (ref 0–44)
AST: 24 U/L (ref 15–41)
Albumin: 3.7 g/dL (ref 3.5–5.0)
Alkaline Phosphatase: 63 U/L (ref 38–126)
Anion gap: 10 (ref 5–15)
BUN: 24 mg/dL — ABNORMAL HIGH (ref 6–20)
CO2: 19 mmol/L — ABNORMAL LOW (ref 22–32)
Calcium: 8.8 mg/dL — ABNORMAL LOW (ref 8.9–10.3)
Chloride: 111 mmol/L (ref 98–111)
Creatinine, Ser: 1.01 mg/dL (ref 0.61–1.24)
GFR, Estimated: >60 mL/min (ref >60)
Glucose, Bld: 139 mg/dL — ABNORMAL HIGH (ref 70–99)
Potassium: 3.4 mmol/L — ABNORMAL LOW (ref 3.5–5.1)
Sodium: 140 mmol/L (ref 135–145)
Total Bilirubin: 1.4 mg/dL — ABNORMAL HIGH (ref 0.3–1.2)
Total Protein: 5.6 g/dL — ABNORMAL LOW (ref 6.5–8.1)

## 2021-12-16 LAB — I-STAT CHEM 8, ED
BUN: 25 mg/dL — ABNORMAL HIGH (ref 6–20)
Calcium, Ion: 1.01 mmol/L — ABNORMAL LOW (ref 1.15–1.40)
Chloride: 108 mmol/L (ref 98–111)
Creatinine, Ser: 1 mg/dL (ref 0.61–1.24)
Glucose, Bld: 134 mg/dL — ABNORMAL HIGH (ref 70–99)
HCT: 46 % (ref 39.0–52.0)
Hemoglobin: 15.6 g/dL (ref 13.0–17.0)
Potassium: 3.2 mmol/L — ABNORMAL LOW (ref 3.5–5.1)
Sodium: 142 mmol/L (ref 135–145)
TCO2: 19 mmol/L — ABNORMAL LOW (ref 22–32)

## 2021-12-16 LAB — SAMPLE TO BLOOD BANK

## 2021-12-16 LAB — LACTIC ACID, PLASMA: Lactic Acid, Venous: 2.6 mmol/L (ref 0.5–1.9)

## 2021-12-16 MED ORDER — OXYCODONE-ACETAMINOPHEN 5-325 MG PO TABS
1.0000 | ORAL_TABLET | Freq: Once | ORAL | Status: AC
  Administered 2021-12-16: 1 via ORAL
  Filled 2021-12-16: qty 1

## 2021-12-16 MED ORDER — CEPHALEXIN 500 MG PO CAPS: 500.0000 mg | ORAL_CAPSULE | Freq: Four times a day (QID) | ORAL | 0 refills | Status: DC

## 2021-12-16 MED ORDER — KETOROLAC TROMETHAMINE 15 MG/ML IJ SOLN
15.0000 mg | Freq: Once | INTRAMUSCULAR | Status: AC
  Administered 2021-12-16: 15 mg via INTRAVENOUS
  Filled 2021-12-16 (×2): qty 1

## 2021-12-16 MED ORDER — FENTANYL CITRATE PF 50 MCG/ML IJ SOSY: 50.0000 ug | PREFILLED_SYRINGE | Freq: Once | INTRAMUSCULAR | Status: DC

## 2021-12-16 MED ORDER — LIDOCAINE-EPINEPHRINE (PF) 2 %-1:200000 IJ SOLN
20.0000 mL | Freq: Once | INTRAMUSCULAR | Status: DC
  Filled 2021-12-16: qty 20

## 2021-12-16 MED ORDER — KETAMINE HCL 50 MG/5ML IJ SOSY: 0.3000 mg/kg | PREFILLED_SYRINGE | Freq: Once | INTRAMUSCULAR | Status: DC

## 2021-12-16 MED ORDER — POTASSIUM CHLORIDE CRYS ER 20 MEQ PO TBCR
40.0000 meq | EXTENDED_RELEASE_TABLET | Freq: Once | ORAL | Status: AC
  Administered 2021-12-16: 40 meq via ORAL
  Filled 2021-12-16: qty 2

## 2021-12-16 MED ORDER — TETANUS-DIPHTH-ACELL PERTUSSIS 5-2.5-18.5 LF-MCG/0.5 IM SUSY
0.5000 mL | PREFILLED_SYRINGE | Freq: Once | INTRAMUSCULAR | Status: AC
  Administered 2021-12-16: 0.5 mL via INTRAMUSCULAR

## 2021-12-16 MED ORDER — CEFAZOLIN SODIUM-DEXTROSE 2-4 GM/100ML-% IV SOLN
2.0000 g | Freq: Once | INTRAVENOUS | Status: AC
  Administered 2021-12-16: 2 g via INTRAVENOUS

## 2021-12-16 MED ORDER — FENTANYL CITRATE PF 50 MCG/ML IJ SOSY
25.0000 ug | PREFILLED_SYRINGE | Freq: Once | INTRAMUSCULAR | Status: AC
  Administered 2021-12-16: 25 ug via INTRAVENOUS
  Filled 2021-12-16 (×2): qty 1
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
## 2021-12-16 NOTE — Discharge Instructions (Addendum)
There was an antibiotic that was sent to your pharmacy.  This is to prevent infection in areas of wounds.  Take as prescribed.  If any areas of wounds do become increasingly painful, swollen, hot, or begin draining pus, please return to the emergency department.  Your stitches will need to be removed in 10 days.

## 2021-12-16 NOTE — Progress Notes (Signed)
Orthopedic Tech Progress Note Patient Details:  Matthew Walker 01/24/1966 [id number] Level 2 Trauma  Patient ID: Matthew Walker, male   DOB: [birth year], 56 y.o.   MRN: [id number]  [name] 12/16/2021, 6:04 PM

## 2021-12-16 NOTE — ED Notes (Signed)
Pt verbalized understanding of d/c instructions, meds, and followup care. Denies questions. VSS, no distress noted. Assisted to wheelchair and to exit with all belongings.  

## 2021-12-16 NOTE — ED Notes (Signed)
Pt downgraded to a Level 2.

## 2021-12-16 NOTE — ED Notes (Signed)
X-ray at bedside
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
## 2021-12-17 ENCOUNTER — Encounter: Payer: Self-pay | Admitting: Urology

## 2022-02-05 ENCOUNTER — Other Ambulatory Visit: Payer: Self-pay | Admitting: Interventional Cardiology

## 2022-03-16 ENCOUNTER — Other Ambulatory Visit: Payer: Self-pay | Admitting: Interventional Cardiology

## 2022-03-31 ENCOUNTER — Telehealth: Payer: Self-pay | Admitting: Interventional Cardiology

## 2022-03-31 ENCOUNTER — Other Ambulatory Visit: Payer: Self-pay | Admitting: Interventional Cardiology

## 2022-03-31 MED ORDER — CLOPIDOGREL BISULFATE 75 MG PO TABS: 75.0000 mg | ORAL_TABLET | Freq: Every day | ORAL | 0 refills | Status: DC

## 2022-03-31 NOTE — Telephone Encounter (Signed)
 *  STAT* If patient is at the pharmacy, call can be transferred to refill team.   1. Which medications need to be refilled? (please list name of each medication and dose if known) clopidogrel (PLAVIX) 75 MG tablet   2. Which pharmacy/location (including street and city if local pharmacy) is medication to be sent to? [facility] - [place], [place] - [address]    3. Do they need a 30 day or 90 day supply? 30 days  Pt made an appt with Ronie Spies on 04/05/22 at 10:55 am. Pt is out of meds needs refill today

## 2022-03-31 NOTE — Telephone Encounter (Signed)
Rx refill sent to pharmacy. 

## 2022-04-04 NOTE — Progress Notes (Deleted)
Cardiology Office Note:    Date:  04/04/2022   ID:  CORDAY WYKA, DOB [birth year], MRN [id number]  PCP:  Aviva Kluver   [facility] Providers Cardiologist:  Lesleigh Noe, MD { Click to update primary MD,subspecialty MD or APP then REFRESH:1}    Referring MD: No ref. provider found   Follow up for CAD, HTN.   History of Present Illness:    Matthew Walker is a 56 y.o. male with a hx of HTN, DM II, CVA 10/2017 (pontine lacune), HLD and CAD with inferior STEMI 07/2017, presents for follow up.    Last seen by Dr. Katrinka Blazing 12/26/20, at that time Mr. Sthilaire was doing well. Still had some residual deficits from CVA in 2019, but has began to work again.    Past Medical History:  Diagnosis Date   Arthritis    "knees, hands" (11/18/2017)   Coronary artery disease    GERD (gastroesophageal reflux disease)    High cholesterol    Hypertension    Sleep apnea    no CPAP   STEMI (ST elevation myocardial infarction) (HCC) 07/30/2017   PCI/DES x1 to mLAD with staged intervention to the pRCA, and Lcx. Normal EF   Stroke (HCC) 11/18/2017   Acute/subacute left pontine CVA/notes 11/18/2017   Type II diabetes mellitus (HCC)     Past Surgical History:  Procedure Laterality Date   CORONARY STENT INTERVENTION N/A 07/30/2017   Procedure: CORONARY STENT INTERVENTION;  Surgeon: Lyn Records, MD;  Location: [facility] INVASIVE CV LAB;  Service: Cardiovascular;  Laterality: N/A;   CORONARY STENT INTERVENTION N/A 08/01/2017   Procedure: CORONARY STENT INTERVENTION;  Surgeon: Marykay Lex, MD;  Location[facility] INV[facility] CV LAB;  Service: Cardiovascular;  Laterality: N/A;   CORONARY/GRAFT ACUTE MI REVASCULARIZATION N/A 07/30/2017   Procedure: Coronary/Graft Acute MI Revascularization;  Surgeon: Lyn Records, MD;  Location: [facility] INVASIVE CV LAB;  Service: Cardiovascular;  Laterality: N/A;   INGUINAL HERNIA REPAIR Bilateral    KNEE ARTHROSCOPY Right    LEFT HEART CATH AND CORONARY ANGIOGRAPHY N/A 07/30/2017   Procedure:  LEFT HEART CATH AND CORONARY ANGIOGRAPHY;  Surgeon: Lyn Records, MD;  Location: [facility] INVASIVE CV [facility];  Service: Cardiovascular;  Laterality: N/A;   LEFT HEART CATH AND CORONARY ANGIOGRAPHY N/A 08/01/2017   Procedure: LEFT HEART CATH AND CORONARY ANGIOGRAPHY;  Surgeon: Marykay Lex, MD;  Location[facility] CV LAB;  Service: Cardiovascular;  Laterality: N/A;   STRABISMUS SURGERY Left 11/24/2018   Procedure: STRABISMUS REPAIR LEFT EYE;  Surgeon: Verne Carrow, MD;  Location: [facility];  Service: Ophthalmology;  Laterality: Left;    Current Medications: No outpatient medications have been marked as taking for the 04/05/22 encounter (Appointment) with Laurann Montana, PA-C.     Allergies:   Bee venom   Social History   Socioeconomic History   Marital status: Married    Spouse name: Not on file   Number of children: Not on file   Years of education: Not on file   Highest education level: Not on file  Occupational History   Not on file  Tobacco Use   Smoking status: Not on file   Smokeless tobacco: Former    Types: Chew   Tobacco comments:    smoke 2-3/day  Vaping Use   Vaping Use: Never used  Substance and Sexual Activity   Alcohol use: Not Currently   Drug use: Not Currently   Sexual activity: Not on file  Other Topics Concern   Not on file  Social History Narrative   ** Merged History Encounter **       Social Determinants of Health   Financial Resource Strain: Not on file  Food Insecurity: Not on file  Transportation Needs: Not on file  Physical Activity: Not on file  Stress: Not on file  Social Connections: Not on file     Family History: The patient's family history is not on file.  ROS:   Please see the history of present illness.     All other systems reviewed and are negative.  EKGs/Labs/Other Studies Reviewed:    The following studies were reviewed today:  11/19/17 echocardiogram complete - EF 60-65, mod concentric hypertrophy, grade  II DD. Ascending aorta, mildy dilated. MV, trivial regurgitation. LA, mildly dilated. PV, trivial regurgitation.   08/11/17 left heart cath - Urgent LAD PCI with DES in setting of anterior STEMI 07/30/17- followed by staged RCA and CFX and RCA PCI with DES    EKG:  EKG is *** ordered today.  The ekg ordered today demonstrates ***  Recent Labs: 12/16/21 - Na 142, K 3.2, Glucose 134, BUN 25, Cr 1.0, hgb 15.6, HCT 46  Recent Lipid Panel    Component Value Date/Time   CHOL 142 11/19/2017 0407   TRIG 273 (H) 11/19/2017 0407   HDL 34 (L) 11/19/2017 0407   CHOLHDL 4.2 11/19/2017 0407   VLDL 55 (H) 11/19/2017 0407   LDLCALC 53 11/19/2017 0407     Risk Assessment/Calculations:   {Does this patient have ATRIAL FIBRILLATION?[phone]}  No BP recorded.  {Refresh Note OR Click here to enter BP  :1}***         Physical Exam:    VS:  There were no vitals taken for this visit.    Wt Readings from Last 3 Encounters:  12/16/21 173 lb (78.5 kg)  09/11/21 173 lb (78.5 kg)  12/26/20 179 lb (81.2 kg)     GEN: *** Well nourished, well developed in no acute distress HEENT: Normal NECK: No JVD; No carotid bruits LYMPHATICS: No lymphadenopathy CARDIAC: ***RRR, no murmurs, rubs, gallops RESPIRATORY:  Clear to auscultation without rales, wheezing or rhonchi  ABDOMEN: Soft, non-tender, non-distended MUSCULOSKELETAL:  No edema; No deformity  SKIN: Warm and dry NEUROLOGIC:  Alert and oriented x 3 PSYCHIATRIC:  Normal affect   ASSESSMENT:    No diagnosis found. PLAN:    In order of problems listed above:  CAD - Continue ASA 81 mg daily, plavix 75 mg daily, lipitor 80 mg daily,  HTN - Continue norvasc 10 mg daily, zestoretic 20-25 mg daily,  Dyslipidemia, goal LDL < 70 - Continue lipitor 80 mg daily Type 2 DM - was farxiga started by PCP after hematuria?      {Are you ordering a CV Procedure (e.g. stress test, cath, DCCV, TEE, etc)?   Press F2        :[id number]    Medication  Adjustments/Labs and Tests Ordered: Current medicines are reviewed at length with the patient today.  Concerns regarding medicines are outlined above.  No orders of the defined types were placed in this encounter.  No orders of the defined types were placed in this encounter.   There are no Patient Instructions on file for this visit.   Signed, Trudi Ida, NP  04/04/2022 4:50 PM    [place]

## 2022-04-05 ENCOUNTER — Ambulatory Visit: Payer: No Typology Code available for payment source | Admitting: Physician Assistant

## 2022-05-18 ENCOUNTER — Other Ambulatory Visit: Payer: Self-pay | Admitting: Interventional Cardiology

## 2022-07-07 ENCOUNTER — Telehealth: Payer: Self-pay | Admitting: Internal Medicine

## 2022-07-07 MED ORDER — CLOPIDOGREL BISULFATE 75 MG PO TABS: 75.0000 mg | ORAL_TABLET | Freq: Every day | ORAL | 0 refills | Status: DC

## 2022-07-07 NOTE — Telephone Encounter (Signed)
*  STAT* If patient is at the pharmacy, call can be transferred to refill team.   1. Which medications need to be refilled? (please list name of each medication and dose if known)  clopidogrel (PLAVIX) 75 MG tablet  TAKE ONE TABLET BY MOUTH ONCE DAILY   2. Which pharmacy/location (including street and city if local pharmacy) is medication to be sent to? [facility] - [place], [place] - [place]   3. Do they need a 30 day or 90 day supply? 32 Day Supply  Pt has an appt scheduled for Monday 03/18

## 2022-07-07 NOTE — Telephone Encounter (Signed)
Pt's medication was sent to pt's pharmacy as requested. Confirmation received.  °

## 2022-07-08 NOTE — Progress Notes (Signed)
Cardiology Office Note:    Date:  07/12/2022   ID:  Matthew Walker, DOB 1965/10/13, MRN [id number]  PCP:  Kathyrn Lass   [facility] Providers Cardiologist:  Lenna Sciara, MD Referring MD: No ref. provider found   Chief Complaint/Reason for Referral: Cardiology follow-up  ASSESSMENT:    1. CAD- S/P PCI   2. Hypertension associated with diabetes ([place])   3. Hyperlipidemia associated with type 2 diabetes mellitus ([place])   4. Type 2 diabetes mellitus without complication, without long-term current use of insulin ([place])   5. History of CVA (cerebrovascular accident)     PLAN:    In order of problems listed above: 1.  Coronary artery disease: Discontinue aspirin and continue indefinite Plavix monotherapy, continue statin.  Will stop beta-blocker as patient's ejection fraction is normal. 2.  Hypertension: Will discontinue lisinopril/hydrochlorothiazide combination pill and start lisinopril 40 mg and had chlorothiazide 25 mg to make up for stopping patient's metoprolol.  Will have patient return next week for blood pressure check. 3.  Hyperlipidemia: Continue atorvastatin, with history of stroke and MI goal LDL is less than 55.  Check lipid panel, LFTs, and LP(a) next week. 4.  Type 2 diabetes: Continue Plavix in lieu of aspirin, atorvastatin, lisinopril, and [place]. 5.  History of stroke: Occurred in 2019, will stop ASA as above and continue plavix monotherapy.  Will obtain bubble study echocardiogram to evaluate further.             Dispo:  Return in about 1 year (around 07/12/2023).      Medication Adjustments/Labs and Tests Ordered: Current medicines are reviewed at length with the patient today.  Concerns regarding medicines are outlined above.  The following changes have been made:     Labs/tests ordered: Orders Placed This Encounter  Procedures   Lipid Profile   Comp Met (CMET)   Lipoprotein A (LPA)   EKG 12-Lead   ECHOCARDIOGRAM COMPLETE BUBBLE STUDY    Medication  Changes: Meds ordered this encounter  Medications   nitroGLYCERIN (NITROSTAT) 0.4 MG SL tablet    Sig: Place 1 tablet (0.4 mg total) under the tongue every 5 (five) minutes as needed for chest pain.    Dispense:  25 tablet    Refill:  3   clopidogrel (PLAVIX) 75 MG tablet    Sig: Take 1 tablet (75 mg total) by mouth daily.    Dispense:  30 tablet    Refill:  0   lisinopril (ZESTRIL) 40 MG tablet    Sig: Take 1 tablet (40 mg total) by mouth daily.    Dispense:  90 tablet    Refill:  3    Patient to stop lisinopril/HCTZ and metoprolol   hydrochlorothiazide (HYDRODIURIL) 25 MG tablet    Sig: Take 1 tablet (25 mg total) by mouth daily.    Dispense:  90 tablet    Refill:  3    Patient to stop lisinopril/HCTZ and metoprolol     Current medicines are reviewed at length with the patient today.  The patient does not have concerns regarding medicines.   History of Present Illness:    FOCUSED PROBLEM LIST:   1.  Anterior ST elevation myocardial infarction status post PCI of the mid LAD followed by staged PCI of the proximal right coronary artery and mid left circumflex 2019 2.  Hypertension 3.  Hyperlipidemia 4.  Type 2 diabetes on metformin 5.  History of subacute left pontine stroke 2019 with residual right-sided weakness 6.  Aortic  atherosclerosis on CT abdomen pelvis 2023  The patient is a 57 y.o. male with the indicated medical history here for routine cardiology follow-up.  He was last seen in 2022 and was doing well without angina, nitroglycerin use, or dyspnea.  He has been doing fairly well.  He denies any issues with his medications.  He denies any angina or dyspnea.  He is required no emergency room visits or hospitalizations.  Fortunately has had no recurrent signs or symptoms of stroke.          Current Medications: Current Meds  Medication Sig   amLODipine (NORVASC) 10 MG tablet Take 10 mg by mouth daily.   atorvastatin (LIPITOR) 80 MG tablet TAKE ONE TABLET BY  MOUTH DAILY AT 6 PM.   FARXIGA 10 MG TABS tablet Take 10 mg by mouth daily.   hydrochlorothiazide (HYDRODIURIL) 25 MG tablet Take 1 tablet (25 mg total) by mouth daily.   lisinopril (ZESTRIL) 40 MG tablet Take 1 tablet (40 mg total) by mouth daily.   metFORMIN (GLUCOPHAGE-XR) 500 MG 24 hr tablet Take 500 mg by mouth in the morning, at noon, in the evening, and at bedtime.   Multiple Vitamins-Minerals (EMERGEN-C IMMUNE PO) Take 1 packet by mouth daily.   sodium chloride (OCEAN) 0.65 % SOLN nasal spray Place 1 spray into both nostrils as needed for congestion.   traMADol (ULTRAM) 50 MG tablet Take 50 mg by mouth 4 (four) times daily as needed.   [DISCONTINUED] aspirin 325 MG tablet Take 1 tablet (325 mg total) by mouth daily.   [DISCONTINUED] clopidogrel (PLAVIX) 75 MG tablet Take 1 tablet (75 mg total) by mouth daily.   [DISCONTINUED] lisinopril-hydrochlorothiazide (PRINZIDE,ZESTORETIC) 20-25 MG tablet Take 1 tablet by mouth daily.   [DISCONTINUED] metoprolol tartrate (LOPRESSOR) 50 MG tablet Take 50 mg by mouth 2 (two) times daily.     Allergies:    Bee venom   Social History:   Social History   Tobacco Use   Smoking status: Former    Packs/day: 0.00    Years: 36.00    Additional pack years: 0.00    Total pack years: 0.00    Types: Cigarettes    Quit date: 07/29/2017    Years since quitting: 4.9   Smokeless tobacco: Former    Types: Chew   Tobacco comments:    smoke 2-3/day  Vaping Use   Vaping Use: Never used  Substance Use Topics   Alcohol use: Not Currently   Drug use: Not Currently     Family Hx: History reviewed. No pertinent family history.   Review of Systems:   Please see the history of present illness.    All other systems reviewed and are negative.     EKGs/Labs/Other Test Reviewed:    EKG:  EKG performed 2022 that I personally reviewed demonstrates sinus rhythm with anterior infarction pattern; EKG performed today that I personally reviewed demonstrates  normal sinus rhythm with left anterior fascicular block, inferior infarction pattern, and anterior septal infarction pattern..  Prior CV studies:  Cardiac Studies & Procedures   CARDIAC CATHETERIZATION  CARDIAC CATHETERIZATION 08/01/2017  Narrative Images from the original result were not included.   Lesion #1: Prox RCA 90% stenosed  A drug-eluting stent was successfully placed using a STENT SYNERGY DES 3.5X16. --> Postdilated to 4.1 mm  Post intervention, there is a 0% residual stenosis.  Prox Cx to Mid Cx lesion is 95% stenosed.  2 Overlapping Drug-Eluting Stents were successfully placed using a STENT SYNERGY  DES 3.5X16 with an overlapping STENT SYNERGY DES 3.5X12. - post-dilated to 4.1 mm  Post intervention, there is a 0% residual stenosis.  Patent LAD stent with stable Left Main and proximal LAD as well as OM 2 disease. Also distal RCA 50% lesion is stable as well.  LV end diastolic pressure is normal.  Successful 2 vessel DES PCI -of proximal RCA and Prox-Mid Cx.  The patient was given supplemental glycerin post PCI for mild chest pain. Mild oozing from the sheath insertion site.  Plan: Transfer to 6 C Post-Procedure Unit Continue DAPT. Continue RF modification per Dr. Tamala Julian.  Anticipate that he should be ready for discharge as early as tomorrow.  Glenetta Hew, MD Glenetta Hew, M.D., M.S. Interventional Cardiologist  Pager # [phone] Phone # [phone] [address] [place], [place] [address]  Findings Coronary Findings Diagnostic  Dominance: Right  Left Main Mid LM lesion is 25% stenosed.  Left Anterior Descending Collaterals Dist LAD filled by collaterals from RPDA.  Prox LAD to Mid LAD lesion is 10% stenosed. The lesion was previously treated.  First Septal Branch  Second Septal Branch Collaterals 2nd Sept filled by collaterals from Inf Sept.  Left Circumflex Vessel is large. Prox Cx to Mid Cx lesion is 95% stenosed. The  lesion is located at the bend, eccentric, irregular and ulcerative.  First Obtuse Marginal Branch Vessel is small in size.  Second Obtuse Marginal Branch Ost 2nd Mrg lesion is 70% stenosed.  Third Obtuse Marginal Branch Vessel is large in size.  Right Coronary Artery Prox RCA lesion is 90% stenosed. The lesion is located proximal to the major branch, eccentric and irregular. Mid RCA lesion is 50% stenosed.  Intervention  Prox Cx to Mid Cx lesion Stent Lesion length:  20 mm. Lesion crossed with guidewire using a WIRE MARVEL STR TIP 190CM. Pre-stent angioplasty was performed using a BALLOON SAPPHIRE 2.5X12. Maximum pressure:  16 atm. Inflation time:  30 sec. A drug-eluting stent was successfully placed using a STENT SYNERGY DES 3.5X16. Maximum pressure: 14 atm. Inflation time: 30 sec. Minimum lumen area:  4.1 mm. Stent strut is well apposed. Post-stent angioplasty was performed using a BALLOON SAPPHIRE [place] 4.0X8. Maximum pressure:  16 atm. Inflation time:  30 sec. After post dilation, there was an apparent obstructing hazy almost robotic appearing segment of about 8 mm --this cleared somewhat with IC nitroglycerin 200 mg, however still looked irregular.  I decided to cover this with additional stent. Stent Lesion length:  20 mm. Lesion crossed with guidewire. A drug-eluting stent was successfully placed using a STENT SYNERGY DES 3.5X12. Maximum pressure: 16 atm. Inflation time: 30 sec. Minimum lumen area:  4.1 mm. Stent strut is well apposed. Stent overlaps previously placed stent. Post-stent angioplasty was performed using a BALLOON SAPPHIRE [place] 4.0X8. Maximum pressure:  16 atm. Inflation time:  20 sec. Following an additional intracoronary nitroglycerin injection, disclose noted.  There actually was a very small branch vessel apparent that came off from the original lesion site. Post-Intervention Lesion Assessment The intervention was successful. Pre-interventional TIMI flow is 3.  Post-intervention TIMI flow is 3. Treated lesion length:  22 mm. No complications occurred at this lesion. Total lesion size cover was 22 mm There is a 0% residual stenosis post intervention.  Prox RCA lesion Stent Lesion length:  14 mm. Lesion crossed with guidewire using a WIRE MARVEL STR TIP 190CM. Pre-stent angioplasty was performed using a BALLOON SAPPHIRE 2.5X12. Maximum pressure:  12 atm. Inflation time:  20  sec. A drug-eluting stent was successfully placed using a STENT SYNERGY DES 3.5X16. Maximum pressure: 16 atm. Inflation time: 30 sec. Minimum lumen area:  4 mm. Stent strut is well apposed. Post-stent angioplasty was performed using a BALLOON SAPPHIRE [place] 4.0X8. Maximum pressure:  16 atm. Inflation time:  20 sec. Post-Intervention Lesion Assessment The intervention was successful. Intentional subintimal strategy was not used. Pre-interventional TIMI flow is 3. Post-intervention TIMI flow is 3. Treated lesion length:  16 mm. No complications occurred at this lesion. There is a 0% residual stenosis post intervention.   CARDIAC CATHETERIZATION 07/30/2017  Narrative  Technically difficult procedure due to tortuosity in the right innominate artery preventing catheter control especially when attempting to engage the right coronary.  This led to crossover to femoral approach.  These difficulties prolong the time to reperfusion.  Severe three-vessel coronary disease with 80-90% proximal RCA stenosis.  RCA is supplying collaterals to the LAD.  25% distal left main narrowing.  95% stenosis in the proximal to mid circumflex within a tortuous segment.  Total occlusion of the mid LAD collateralized from the right coronary with thrombus staining.  The lesion is felt to represent the culprit for the patient's presentation.  The LAD was successfully treated with angioplasty and stenting using a 28 x 2.75 mm Synergy stent reducing the 100% stenosis to less than 10% with TIMI grade III flow.   Reperfusion arrhythmias and transient increase in chest discomfort was noted.  Normal left ventricular end-diastolic pressure.  Left ventricular function was not assessed.  RECOMMENDATIONS:   2D Doppler echocardiogram to fully assess LV function and aortic root size (suspected to be somewhat dilated based upon experience during the procedure).  Aggressive risk factor modification with high intensity statin therapy, smoking cessation, blood pressure control, and glycemic control.  Staged PCI on the circumflex and right coronary prior to discharge.  This needs to be discussed among the interventional team.  Findings Coronary Findings Diagnostic  Dominance: Right  Left Main Mid LM lesion is 25% stenosed.  Left Anterior Descending Collaterals Dist LAD filled by collaterals from RPDA.  Prox LAD to Mid LAD lesion is 100% stenosed.  First Septal Branch  Second Septal Branch Collaterals 2nd Sept filled by collaterals from Inf Sept.  Left Circumflex Prox Cx to Mid Cx lesion is 95% stenosed.  First Obtuse Marginal Branch Vessel is small in size.  Second Obtuse Marginal Branch Ost 2nd Mrg lesion is 70% stenosed.  Third Obtuse Marginal Branch Vessel is large in size.  Right Coronary Artery Prox RCA lesion is 90% stenosed. Mid RCA lesion is 50% stenosed.  Intervention  Prox LAD to Mid LAD lesion Stent A stent was successfully placed. Post-Intervention Lesion Assessment The intervention was successful. There is a 10% residual stenosis post intervention.     ECHOCARDIOGRAM  ECHOCARDIOGRAM COMPLETE 11/19/2017  Narrative *[facility]* [address], [place] [address] [phone]  ------------------------------------------------------------------- Echocardiography  Patient:    Matthew Walker, Matthew Walker MR #:       [id number] Study Date: 11/19/2017 Gender:     M Age:        46 Height:     177.8 cm Weight:     81.6 kg BSA:        2.02  m^2 Pt. Status: Room:       [id number]  ATTENDING    Francine Graven [id number] [place], [place]  Isaac Bliss, Rayford Halsted PERFORMING   [facility], Inpatient SONOGRAPHER  Madelaine Etienne  cc:  ------------------------------------------------------------------- LV EF: 60% -   65%  ------------------------------------------------------------------- Indications:      TIA 435.9.  ------------------------------------------------------------------- Study Conclusions  - Left ventricle: The cavity size was normal. There was moderate concentric hypertrophy. Systolic function was normal. The estimated ejection fraction was in the range of 60% to 65%. Features are consistent with a pseudonormal left ventricular filling pattern, with concomitant abnormal relaxation and increased filling pressure (grade 2 diastolic dysfunction). - Aortic valve: Valve area (VTI): 2.84 cm^2. Valve area (Vmax): 2.73 cm^2. Valve area (Vmean): 2.75 cm^2. - Aorta: Ascending aortic diameter: 44 mm (S). - Ascending aorta: The ascending aorta was mildly dilated. - Mitral valve: There was trivial regurgitation. - Left atrium: The atrium was mildly dilated. - Pulmonic valve: There was trivial regurgitation. - Inferior vena cava: The vessel was mildly dilated.  ------------------------------------------------------------------- Study data:  Comparison was made to the study of 07/31/2017.  Study status:  Routine.  Procedure:  Transthoracic echocardiography. Image quality was good.          Echocardiography.  M-mode, complete 2D, spectral Doppler, and color Doppler.  Birthdate: Patient birthdate: [birth year].  Age:  Patient is 57 yr old.  Sex: Gender: male.    BMI: 25.8 kg/m^2.  Blood pressure:     168/103 Patient status:  Inpatient.  Study date:  Study date: 11/19/2017. Study time: 12:57 PM.  Location:   Bedside.  -------------------------------------------------------------------  ------------------------------------------------------------------- Left ventricle:  The cavity size was normal. There was moderate concentric hypertrophy. Systolic function was normal. The estimated ejection fraction was in the range of 60% to 65%. Features are consistent with a pseudonormal left ventricular filling pattern, with concomitant abnormal relaxation and increased filling pressure (grade 2 diastolic dysfunction).  ------------------------------------------------------------------- Aortic valve:   Structurally normal valve.   Cusp separation was normal.  Doppler:  Transvalvular velocity was within the normal range. There was no stenosis. There was no regurgitation.    VTI ratio of LVOT to aortic valve: 0.82. Valve area (VTI): 2.84 cm^2. Indexed valve area (VTI): 1.41 cm^2/m^2. Peak velocity ratio of LVOT to aortic valve: 0.79. Valve area (Vmax): 2.73 cm^2. Indexed valve area (Vmax): 1.35 cm^2/m^2. Mean velocity ratio of LVOT to aortic valve: 0.8. Valve area (Vmean): 2.75 cm^2. Indexed valve area (Vmean): 1.36 cm^2/m^2.    Mean gradient (S): 5 mm Hg. Peak gradient (S): 9 mm Hg.  ------------------------------------------------------------------- Aorta:  Ascending aorta: The ascending aorta was mildly dilated.  ------------------------------------------------------------------- Mitral valve:   Doppler:  There was trivial regurgitation.    Peak gradient (D): 3 mm Hg.  ------------------------------------------------------------------- Left atrium:  The atrium was mildly dilated.  ------------------------------------------------------------------- Pulmonic valve:    Structurally normal valve.   Cusp separation was normal.  Doppler:  Transvalvular velocity was within the normal range. There was trivial regurgitation.  ------------------------------------------------------------------- Systemic  veins: Inferior vena cava: The vessel was mildly dilated.  ------------------------------------------------------------------- Measurements  Left ventricle                           Value          Reference LV ID, ED, PLAX chordal                  45    mm       43 - 52 LV ID, ES, PLAX chordal  30    mm       23 - 38 LV fx shortening, PLAX chordal           33    %        >=29 LV PW thickness, ED                      14    mm       ---------- IVS/LV PW ratio, ED                      1              <=1.3 Stroke volume, 2D                        105   ml       ---------- Stroke volume/bsa, 2D                    52    ml/m^2   ---------- LV e&', lateral                           8.49  cm/s     ---------- LV E/e&', lateral                         9.72           ---------- LV e&', medial                            6.42  cm/s     ---------- LV E/e&', medial                          12.85          ---------- LV e&', average                           7.46  cm/s     ---------- LV E/e&', average                         11.07          ----------  Ventricular septum                       Value          Reference IVS thickness, ED                        14    mm       ----------  LVOT                                     Value          Reference LVOT ID, S                               21    mm       ---------- LVOT area  3.46  cm^2     ---------- LVOT peak velocity, S                    119   cm/s     ---------- LVOT mean velocity, S                    85.1  cm/s     ---------- LVOT VTI, S                              30.3  cm       ---------- LVOT peak gradient, S                    6     mm Hg    ----------  Aortic valve                             Value          Reference Aortic valve peak velocity, S            151   cm/s     ---------- Aortic valve mean velocity, S            107   cm/s     ---------- Aortic valve VTI, S                       36.9  cm       ---------- Aortic mean gradient, S                  5     mm Hg    ---------- Aortic peak gradient, S                  9     mm Hg    ---------- VTI ratio, LVOT/AV                       0.82           ---------- Aortic valve area, VTI                   2.84  cm^2     ---------- Aortic valve area/bsa, VTI               1.41  cm^2/m^2 ---------- Velocity ratio, peak, LVOT/AV            0.79           ---------- Aortic valve area, peak velocity         2.73  cm^2     ---------- Aortic valve area/bsa, peak              1.35  cm^2/m^2 ---------- velocity Velocity ratio, mean, LVOT/AV            0.8            ---------- Aortic valve area, mean velocity         2.75  cm^2     ---------- Aortic valve area/bsa, mean              1.36  cm^2/m^2 ---------- velocity  Aorta  Value          Reference Aortic root ID, ED                       35    mm       ---------- Ascending aorta ID, A-P, S               44    mm       ----------  Left atrium                              Value          Reference LA ID, A-P, ES                           40    mm       ---------- LA ID/bsa, A-P                           1.98  cm/m^2   <=2.2 LA volume, S                             61.1  ml       ---------- LA volume/bsa, S                         30.3  ml/m^2   ---------- LA volume, ES, 1-p A4C                   49    ml       ---------- LA volume/bsa, ES, 1-p A4C               24.3  ml/m^2   ---------- LA volume, ES, 1-p A2C                   74.3  ml       ---------- LA volume/bsa, ES, 1-p A2C               36.8  ml/m^2   ----------  Mitral valve                             Value          Reference Mitral E-wave peak velocity              82.5  cm/s     ---------- Mitral A-wave peak velocity              67.4  cm/s     ---------- Mitral peak gradient, D                  3     mm Hg    ---------- Mitral E/A ratio, peak                   1.2             ----------  Pulmonary veins                          Value          Reference Pulmonary vein peak velocity, S  72    cm/s     ---------- Pulmonary vein peak velocity, D          41    cm/s     ---------- Pulmonary vein velocity ratio,           1.7            ---------- peak, S/D  Right atrium                             Value          Reference RA ID, S-I, ES, A4C                      47.7  mm       34 - 49 RA area, ES, A4C                         17.9  cm^2     8.3 - 19.5 RA volume, ES, A/L                       54.1  ml       ---------- RA volume/bsa, ES, A/L                   26.8  ml/m^2   ----------  Systemic veins                           Value          Reference Estimated CVP                            3     mm Hg    ----------  Right ventricle                          Value          Reference RV ID, ED, PLAX                          38    mm       19 - 38 TAPSE                                    19.9  mm       ---------- RV s&', lateral, S                        13.6  cm/s     ----------  Legend: (L)  and  (H)  mark values outside specified reference range.  ------------------------------------------------------------------- Prepared and Electronically Authenticated by  Fransico Him, MD 2019-07-27T12:38:07             Other studies Reviewed: Review of the additional studies/records demonstrates: Aortic atherosclerosis on CT abdomen pelvis 2023  Recent Labs: 12/16/2021: ALT 18; BUN 25; Creatinine, Ser 1.00; Hemoglobin 15.6; Platelets 148; Potassium 3.2; Sodium 142   Recent Lipid Panel Lab Results  Component Value Date/Time   CHOL 142 11/19/2017 04:07 AM   TRIG 273 (H) 11/19/2017 04:07 AM   HDL 34 (L) 11/19/2017 04:07 AM   LDLCALC 53 11/19/2017  04:07 AM    Risk Assessment/Calculations:                Physical Exam:    VS:  BP 130/80   Pulse 65   Ht 5\' 10"  (1.778 m)   Wt 172 lb 12.8 oz (78.4 kg)   SpO2 95%   BMI 24.79 kg/m    Wt Readings  from Last 3 Encounters:  07/12/22 172 lb 12.8 oz (78.4 kg)  12/16/21 173 lb (78.5 kg)  09/11/21 173 lb (78.5 kg)    GENERAL:  No apparent distress, AOx3 HEENT:  No carotid bruits, +2 carotid impulses, no scleral icterus CAR: RRR no murmurs, gallops, rubs, or thrills RES:  Clear to auscultation bilaterally ABD:  Soft, nontender, nondistended, positive bowel sounds x 4 VASC:  +2 radial pulses, +2 carotid pulses, palpable pedal pulses NEURO:  CN 2-12 grossly intact; motor and sensory grossly intact PSYCH:  No active depression or anxiety EXT:  No edema, ecchymosis, or cyanosis  Signed, Early Osmond, MD  07/12/2022 [address], [place], [place]  [address] Phone: [phone]; Fax: [phone]   Note:  This document was prepared using Dragon voice recognition software and may include unintentional dictation errors.

## 2022-07-12 ENCOUNTER — Ambulatory Visit: Payer: No Typology Code available for payment source | Attending: Internal Medicine | Admitting: Internal Medicine

## 2022-07-12 ENCOUNTER — Encounter: Payer: Self-pay | Admitting: Internal Medicine

## 2022-07-12 VITALS — BP 130/80 | HR 65 | Ht 70.0 in | Wt 172.8 lb

## 2022-07-12 DIAGNOSIS — E785 Hyperlipidemia, unspecified: Secondary | ICD-10-CM

## 2022-07-12 DIAGNOSIS — E1169 Type 2 diabetes mellitus with other specified complication: Secondary | ICD-10-CM | POA: Diagnosis not present

## 2022-07-12 DIAGNOSIS — E1159 Type 2 diabetes mellitus with other circulatory complications: Secondary | ICD-10-CM | POA: Diagnosis not present

## 2022-07-12 DIAGNOSIS — Z9861 Coronary angioplasty status: Secondary | ICD-10-CM

## 2022-07-12 DIAGNOSIS — E119 Type 2 diabetes mellitus without complications: Secondary | ICD-10-CM | POA: Diagnosis not present

## 2022-07-12 DIAGNOSIS — I152 Hypertension secondary to endocrine disorders: Secondary | ICD-10-CM

## 2022-07-12 DIAGNOSIS — Z8673 Personal history of transient ischemic attack (TIA), and cerebral infarction without residual deficits: Secondary | ICD-10-CM

## 2022-07-12 DIAGNOSIS — I2510 Atherosclerotic heart disease of native coronary artery without angina pectoris: Secondary | ICD-10-CM

## 2022-07-12 MED ORDER — HYDROCHLOROTHIAZIDE 25 MG PO TABS: 25.0000 mg | ORAL_TABLET | Freq: Every day | ORAL | 3 refills | Status: DC

## 2022-07-12 MED ORDER — NITROGLYCERIN 0.4 MG SL SUBL: 0.4000 mg | SUBLINGUAL_TABLET | SUBLINGUAL | 3 refills | Status: AC | PRN

## 2022-07-12 MED ORDER — CLOPIDOGREL BISULFATE 75 MG PO TABS: 75.0000 mg | ORAL_TABLET | Freq: Every day | ORAL | 0 refills | Status: DC

## 2022-07-12 MED ORDER — LISINOPRIL 40 MG PO TABS: 40.0000 mg | ORAL_TABLET | Freq: Every day | ORAL | 3 refills | Status: DC

## 2022-07-12 NOTE — Patient Instructions (Signed)
Medication Instructions:  Your physician has recommended you make the following change in your medication:   Stop aspirin. Stop lisinopril/hydrochlorothiazide Stop metoprolol. Start lisinopril 40 mg by mouth daily. Start hydrochlorothiazide 25 mg by mouth daily   *If you need a refill on your cardiac medications before your next appointment, please call your pharmacy*   Lab Work: Your physician recommends that you return for lab work in: one week.  Lipid, CMET and Lipoprotein(a).  Nurse visit for blood pressure check the same day  If you have labs (blood work) drawn today and your tests are completely normal, you will receive your results only by: [place] (if you have MyChart) OR A paper copy in the mail If you have any lab test that is abnormal or we need to change your treatment, we will call you to review the results.   Testing/Procedures: Your physician has requested that you have an echocardiogram with bubble study. Echocardiography is a painless test that uses sound waves to create images of your heart. It provides your doctor with information about the size and shape of your heart and how well your heart's chambers and valves are working. This procedure takes approximately one hour. There are no restrictions for this procedure. Please do NOT wear cologne, perfume, aftershave, or lotions (deodorant is allowed). Please arrive 15 minutes prior to your appointment time.    Follow-Up: At [facility], you and your health needs are our priority.  As part of our continuing mission to provide you with exceptional heart care, we have created designated Provider Care Teams.  These Care Teams include your primary Cardiologist (physician) and Advanced Practice Providers (APPs -  Physician Assistants and Nurse Practitioners) who all work together to provide you with the care you need, when you need it.  We recommend signing up for the patient portal called "MyChart".  Sign up  information is provided on this After Visit Summary.  MyChart is used to connect with patients for Virtual Visits (Telemedicine).  Patients are able to view lab/test results, encounter notes, upcoming appointments, etc.  Non-urgent messages can be sent to your provider as well.   To learn more about what you can do with MyChart, go to [email or web address].    Your next appointment:   12 month(s)  Provider:   APP     Other Instructions  Please schedule patient for blood pressure check in one week

## 2022-07-22 ENCOUNTER — Ambulatory Visit: Payer: No Typology Code available for payment source | Attending: Internal Medicine | Admitting: Internal Medicine

## 2022-07-22 ENCOUNTER — Ambulatory Visit: Payer: No Typology Code available for payment source

## 2022-07-22 VITALS — BP 130/68 | HR 88 | Ht 70.0 in | Wt 170.6 lb

## 2022-07-22 DIAGNOSIS — E1159 Type 2 diabetes mellitus with other circulatory complications: Secondary | ICD-10-CM

## 2022-07-22 DIAGNOSIS — I2510 Atherosclerotic heart disease of native coronary artery without angina pectoris: Secondary | ICD-10-CM

## 2022-07-22 DIAGNOSIS — E119 Type 2 diabetes mellitus without complications: Secondary | ICD-10-CM

## 2022-07-22 DIAGNOSIS — E785 Hyperlipidemia, unspecified: Secondary | ICD-10-CM

## 2022-07-22 DIAGNOSIS — E1169 Type 2 diabetes mellitus with other specified complication: Secondary | ICD-10-CM

## 2022-07-22 DIAGNOSIS — Z8673 Personal history of transient ischemic attack (TIA), and cerebral infarction without residual deficits: Secondary | ICD-10-CM

## 2022-07-22 NOTE — Progress Notes (Signed)
   Nurse Visit   Date of Encounter: 07/22/2022 ID: Matthew Walker, DOB [birth year], MRN [id number]  PCP:  Manon Hilding, MD   [facility] Providers Cardiologist:  Early Osmond, MD      Visit Details   VS:  BP 130/68 (BP Location: Right Arm, Patient Position: Sitting, Cuff Size: Normal)   Pulse 88   Ht 5\' 10"  (1.778 m)   Wt 170 lb 9.6 oz (77.4 kg)   SpO2 97%   BMI 24.48 kg/m  , BMI Body mass index is 24.48 kg/m.  Wt Readings from Last 3 Encounters:  07/22/22 170 lb 9.6 oz (77.4 kg)  07/12/22 172 lb 12.8 oz (78.4 kg)  12/16/21 173 lb (78.5 kg)     Reason for visit: BP check Performed today: Vitals, EKG, Provider consulted:Taylor, and Education Changes (medications, testing, etc.) : No changes made;continue plan. Encouraged to monitor BP at home once weekly as he expresses obsession with checking, so reports was told to stop completely. Length of Visit: 15 minutes    Medications Adjustments/Labs and Tests Ordered: No orders of the defined types were placed in this encounter.  No orders of the defined types were placed in this encounter.    Signed, Ma Hillock, RN  07/22/2022 11:01 AM

## 2022-07-22 NOTE — Patient Instructions (Signed)
Medication Instructions:  Your physician recommends that you continue on your current medications as directed. Please refer to the Current Medication list given to you today.  *If you need a refill on your cardiac medications before your next appointment, please call your pharmacy*   Lab Work: NONE If you have labs (blood work) drawn today and your tests are completely normal, you will receive your results only by: [place] (if you have MyChart) OR A paper copy in the mail If you have any lab test that is abnormal or we need to change your treatment, we will call you to review the results.   Testing/Procedures: NONE   Follow-Up: At [facility], you and your health needs are our priority.  As part of our continuing mission to provide you with exceptional heart care, we have created designated Provider Care Teams.  These Care Teams include your primary Cardiologist (physician) and Advanced Practice Providers (APPs -  Physician Assistants and Nurse Practitioners) who all work together to provide you with the care you need, when you need it.  We recommend signing up for the patient portal called "MyChart".  Sign up information is provided on this After Visit Summary.  MyChart is used to connect with patients for Virtual Visits (Telemedicine).  Patients are able to view lab/test results, encounter notes, upcoming appointments, etc.  Non-urgent messages can be sent to your provider as well.   To learn more about what you can do with MyChart, go to [email or web address].    Your next appointment:   As scheduled  Provider:   Cristopher Peru, MD

## 2022-07-23 ENCOUNTER — Ambulatory Visit: Payer: No Typology Code available for payment source

## 2022-07-23 ENCOUNTER — Other Ambulatory Visit: Payer: No Typology Code available for payment source

## 2022-07-24 LAB — LIPID PANEL
Chol/HDL Ratio: 3.2 ratio (ref 0.0–5.0)
Cholesterol, Total: 146 mg/dL (ref 100–199)
HDL: 45 mg/dL (ref >39)
LDL Chol Calc (NIH): 82 mg/dL (ref 0–99)
Triglycerides: 102 mg/dL (ref 0–149)
VLDL Cholesterol Cal: 19 mg/dL (ref 5–40)

## 2022-07-24 LAB — COMPREHENSIVE METABOLIC PANEL
ALT: 23 IU/L (ref 0–44)
AST: 14 IU/L (ref 0–40)
Albumin/Globulin Ratio: 2.1 (ref 1.2–2.2)
Albumin: 4.8 g/dL (ref 3.8–4.9)
Alkaline Phosphatase: 100 IU/L (ref 44–121)
BUN/Creatinine Ratio: 21 — ABNORMAL HIGH (ref 9–20)
BUN: 16 mg/dL (ref 6–24)
Bilirubin Total: 1.2 mg/dL (ref 0.0–1.2)
CO2: 21 mmol/L (ref 20–29)
Calcium: 10 mg/dL (ref 8.7–10.2)
Chloride: 104 mmol/L (ref 96–106)
Creatinine, Ser: 0.78 mg/dL (ref 0.76–1.27)
Globulin, Total: 2.3 g/dL (ref 1.5–4.5)
Glucose: 108 mg/dL — ABNORMAL HIGH (ref 70–99)
Potassium: 4.2 mmol/L (ref 3.5–5.2)
Sodium: 145 mmol/L — ABNORMAL HIGH (ref 134–144)
Total Protein: 7.1 g/dL (ref 6.0–8.5)
eGFR: 104 mL/min/{1.73_m2} (ref >59)

## 2022-07-24 LAB — LIPOPROTEIN A (LPA): Lipoprotein (a): 84.2 nmol/L — ABNORMAL HIGH (ref <75.0)

## 2022-07-27 ENCOUNTER — Other Ambulatory Visit: Payer: Self-pay

## 2022-07-27 DIAGNOSIS — E785 Hyperlipidemia, unspecified: Secondary | ICD-10-CM

## 2022-07-27 MED ORDER — EZETIMIBE 10 MG PO TABS: 10.0000 mg | ORAL_TABLET | Freq: Every day | ORAL | 2 refills | Status: DC

## 2022-08-06 ENCOUNTER — Ambulatory Visit (HOSPITAL_COMMUNITY): Payer: No Typology Code available for payment source | Attending: Internal Medicine

## 2022-08-06 DIAGNOSIS — Z8673 Personal history of transient ischemic attack (TIA), and cerebral infarction without residual deficits: Secondary | ICD-10-CM | POA: Insufficient documentation

## 2022-08-06 DIAGNOSIS — Z9861 Coronary angioplasty status: Secondary | ICD-10-CM

## 2022-08-06 DIAGNOSIS — I152 Hypertension secondary to endocrine disorders: Secondary | ICD-10-CM | POA: Insufficient documentation

## 2022-08-06 DIAGNOSIS — I2510 Atherosclerotic heart disease of native coronary artery without angina pectoris: Secondary | ICD-10-CM | POA: Insufficient documentation

## 2022-08-06 DIAGNOSIS — E785 Hyperlipidemia, unspecified: Secondary | ICD-10-CM | POA: Diagnosis present

## 2022-08-06 DIAGNOSIS — E1159 Type 2 diabetes mellitus with other circulatory complications: Secondary | ICD-10-CM | POA: Diagnosis not present

## 2022-08-06 DIAGNOSIS — E1169 Type 2 diabetes mellitus with other specified complication: Secondary | ICD-10-CM | POA: Insufficient documentation

## 2022-08-06 LAB — ECHOCARDIOGRAM COMPLETE BUBBLE STUDY
Area-P 1/2: 3.27 cm2
Calc EF: 57.4 %
S' Lateral: 2.7 cm
Single Plane A2C EF: 55.9 %
Single Plane A4C EF: 58.9 %

## 2022-08-13 ENCOUNTER — Other Ambulatory Visit: Payer: Self-pay | Admitting: Internal Medicine

## 2022-10-01 ENCOUNTER — Ambulatory Visit: Payer: No Typology Code available for payment source | Attending: Family Medicine

## 2022-11-26 ENCOUNTER — Other Ambulatory Visit: Payer: Self-pay

## 2022-11-26 DIAGNOSIS — E785 Hyperlipidemia, unspecified: Secondary | ICD-10-CM

## 2022-11-26 MED ORDER — EZETIMIBE 10 MG PO TABS
10.0000 mg | ORAL_TABLET | Freq: Every day | ORAL | 2 refills | Status: DC
Start: 2022-11-26 — End: 2023-11-04

## 2023-07-19 ENCOUNTER — Other Ambulatory Visit: Payer: Self-pay | Admitting: Internal Medicine

## 2023-08-15 ENCOUNTER — Other Ambulatory Visit: Payer: Self-pay | Admitting: Internal Medicine

## 2023-09-06 ENCOUNTER — Other Ambulatory Visit: Payer: Self-pay | Admitting: Internal Medicine

## 2023-09-06 ENCOUNTER — Telehealth: Payer: Self-pay | Admitting: Internal Medicine

## 2023-09-06 MED ORDER — HYDROCHLOROTHIAZIDE 25 MG PO TABS: 25.0000 mg | ORAL_TABLET | Freq: Every day | ORAL | 0 refills | Status: DC

## 2023-09-06 MED ORDER — CLOPIDOGREL BISULFATE 75 MG PO TABS
75.0000 mg | ORAL_TABLET | Freq: Every day | ORAL | 0 refills | Status: DC
Start: 2023-09-06 — End: 2023-12-08

## 2023-09-06 MED ORDER — LISINOPRIL 40 MG PO TABS: 40.0000 mg | ORAL_TABLET | Freq: Every day | ORAL | 0 refills | Status: DC

## 2023-09-06 NOTE — Telephone Encounter (Signed)
 Pt's medications were sent to pt's pharmacy as requested. Confirmation received.

## 2023-09-06 NOTE — Telephone Encounter (Signed)
*  STAT* If patient is at the pharmacy, call can be transferred to refill team.   1. Which medications need to be refilled? (please list name of each medication and dose if known) clopidogrel  (PLAVIX ) 75 MG tablet  hydrochlorothiazide  (HYDRODIURIL ) 25 MG tablet  lisinopril  (ZESTRIL ) 40 MG tablet   2. Which pharmacy/location (including street and city if local pharmacy) is medication to be sent to?  [facility] - [place], [place] - [address]   3. Do they need a 30 day or 90 day supply? 90 pt is completely out

## 2023-09-07 ENCOUNTER — Other Ambulatory Visit: Payer: Self-pay | Admitting: Internal Medicine

## 2023-10-18 NOTE — Progress Notes (Deleted)
  Cardiology Office Note:  .   Date:  10/18/2023  ID:  Matthew Walker, DOB August 15, 1965, MRN [id number] PCP: Matthew Deward ORN, MD  [facility] Providers Cardiologist:  Matthew MARLA Red, MD { Click to update primary MD,subspecialty MD or APP then REFRESH:1}   History of Present Illness: .   Matthew Walker is a 58 y.o. male with history of ant STEMI DES mLAD staged DES pRCA & mCfx 2019, HTN, HLD, DM2, subacute left pontine stroke 2019, aortic atherosclerosis 2023.  ROS: ***  Studies Reviewed: Matthew Walker         Prior CV Studies: {Select studies to display:26339}  ***  Risk Assessment/Calculations:   {Does this patient have ATRIAL FIBRILLATION?[phone]} No BP recorded.  {Refresh Note OR Click here to enter BP  :1}***       Physical Exam:   VS:  There were no vitals taken for this visit.   Orhtostatics: No data found. Wt Readings from Last 3 Encounters:  07/22/22 170 lb 9.6 oz (77.4 kg)  07/12/22 172 lb 12.8 oz (78.4 kg)  12/16/21 173 lb (78.5 kg)    GEN: Well nourished, well developed in no acute distress NECK: No JVD; No carotid bruits CARDIAC: ***RRR, no murmurs, rubs, gallops RESPIRATORY:  Clear to auscultation without rales, wheezing or rhonchi  ABDOMEN: Soft, non-tender, non-distended EXTREMITIES:  No edema; No deformity   ASSESSMENT AND PLAN: .      Coronary artery disease: Discontinue aspirin  and continue indefinite Plavix  monotherapy, continue statin.  Will stop beta-blocker as patient's ejection fraction is normal. 2.  Hypertension: Will discontinue lisinopril /hydrochlorothiazide  combination pill and start lisinopril  40 mg and had chlorothiazide 25 mg to make up for stopping patient's metoprolol .  Will have patient return next week for blood pressure check. 3.  Hyperlipidemia: Continue atorvastatin , with history of stroke and MI goal LDL is less than 55.  Check lipid panel, LFTs, and LP(a) next week. 4.  Type 2 diabetes: Continue Plavix  in lieu of aspirin , atorvastatin ,  lisinopril , and [place]. 5.  History of stroke: Occurred in 2019, will stop ASA as above and continue plavix  monotherapy.  Will obtain bubble study echocardiogram to evaluate further.         {Are you ordering a CV Procedure (e.g. stress test, cath, DCCV, TEE, etc)?   Press F2        :[id number]}  Dispo: ***  Signed, Olivia Pavy, PA-C

## 2023-11-01 ENCOUNTER — Ambulatory Visit: Attending: Cardiology | Admitting: Physician Assistant

## 2023-11-03 ENCOUNTER — Other Ambulatory Visit: Payer: Self-pay | Admitting: Internal Medicine

## 2023-11-03 DIAGNOSIS — E785 Hyperlipidemia, unspecified: Secondary | ICD-10-CM

## 2023-12-08 ENCOUNTER — Other Ambulatory Visit: Payer: Self-pay

## 2023-12-08 ENCOUNTER — Telehealth: Payer: Self-pay | Admitting: Internal Medicine

## 2023-12-08 DIAGNOSIS — E785 Hyperlipidemia, unspecified: Secondary | ICD-10-CM

## 2023-12-08 MED ORDER — CLOPIDOGREL BISULFATE 75 MG PO TABS: 75.0000 mg | ORAL_TABLET | Freq: Every day | ORAL | 0 refills | Status: DC

## 2023-12-08 MED ORDER — LISINOPRIL 40 MG PO TABS: 40.0000 mg | ORAL_TABLET | Freq: Every day | ORAL | 0 refills | Status: DC

## 2023-12-08 MED ORDER — HYDROCHLOROTHIAZIDE 25 MG PO TABS: 25.0000 mg | ORAL_TABLET | Freq: Every day | ORAL | 0 refills | Status: DC

## 2023-12-08 NOTE — Telephone Encounter (Signed)
 RX sent in

## 2023-12-08 NOTE — Telephone Encounter (Signed)
*  STAT* If patient is at the pharmacy, call can be transferred to refill team.   1. Which medications need to be refilled? (please list name of each medication and dose if known)   clopidogrel  (PLAVIX ) 75 MG tablet  hydrochlorothiazide  (HYDRODIURIL ) 25 MG tablet   ezetimibe  (ZETIA ) 10 MG tablet    lisinopril  (ZESTRIL ) 40 MG tablet   2. Which pharmacy/location (including street and city if local pharmacy) is medication to be sent to? [facility] [place], [place] - [address] [place] Phone: [phone]  Fax: [phone]     3. Do they need a 30 day or 90 day supply? 30

## 2023-12-29 ENCOUNTER — Ambulatory Visit: Attending: Nurse Practitioner | Admitting: Nurse Practitioner

## 2023-12-29 ENCOUNTER — Encounter: Payer: Self-pay | Admitting: Nurse Practitioner

## 2023-12-29 VITALS — BP 125/79 | HR 75 | Ht 70.0 in | Wt 175.0 lb

## 2023-12-29 DIAGNOSIS — E785 Hyperlipidemia, unspecified: Secondary | ICD-10-CM

## 2023-12-29 DIAGNOSIS — I10 Essential (primary) hypertension: Secondary | ICD-10-CM

## 2023-12-29 DIAGNOSIS — I2510 Atherosclerotic heart disease of native coronary artery without angina pectoris: Secondary | ICD-10-CM

## 2023-12-29 DIAGNOSIS — Z8673 Personal history of transient ischemic attack (TIA), and cerebral infarction without residual deficits: Secondary | ICD-10-CM

## 2023-12-29 DIAGNOSIS — E119 Type 2 diabetes mellitus without complications: Secondary | ICD-10-CM

## 2023-12-29 DIAGNOSIS — I77810 Thoracic aortic ectasia: Secondary | ICD-10-CM | POA: Diagnosis not present

## 2023-12-29 MED ORDER — EZETIMIBE 10 MG PO TABS: 10.0000 mg | ORAL_TABLET | Freq: Every day | ORAL | 3 refills | Status: AC

## 2023-12-29 MED ORDER — ATORVASTATIN CALCIUM 80 MG PO TABS: 80.0000 mg | ORAL_TABLET | Freq: Every day | ORAL | 3 refills | Status: AC

## 2023-12-29 MED ORDER — AMLODIPINE BESYLATE 10 MG PO TABS: 10.0000 mg | ORAL_TABLET | Freq: Every day | ORAL | 3 refills | Status: AC

## 2023-12-29 MED ORDER — CLOPIDOGREL BISULFATE 75 MG PO TABS: 75.0000 mg | ORAL_TABLET | Freq: Every day | ORAL | 3 refills | Status: AC

## 2023-12-29 MED ORDER — HYDROCHLOROTHIAZIDE 25 MG PO TABS: 25.0000 mg | ORAL_TABLET | Freq: Every day | ORAL | 3 refills | Status: AC

## 2023-12-29 MED ORDER — LISINOPRIL 40 MG PO TABS: 40.0000 mg | ORAL_TABLET | Freq: Every day | ORAL | 3 refills | Status: AC

## 2023-12-29 NOTE — Progress Notes (Signed)
 Office Visit    Patient Name: Matthew Walker Date of Encounter: 12/29/2023  Primary Care Provider:  Atilano Deward ORN, MD Primary Cardiologist:  Arun K Thukkani, MD  Chief Complaint    58 year old male with a history of CAD DES-mLAD, staged PCI/DES-pRCA, DES-p-mLCx in 2019, hypertension, hyperlipidemia, CVA, type 2 diabetes, OSA, and GERD who presents for follow-up related to CAD.  Past Medical History    Past Medical History:  Diagnosis Date   Arthritis    knees, hands (11/18/2017)   Coronary artery disease    GERD (gastroesophageal reflux disease)    High cholesterol    Hypertension    Sleep apnea    no CPAP   STEMI (ST elevation myocardial infarction) (HCC) 07/30/2017   PCI/DES x1 to mLAD with staged intervention to the pRCA, and Lcx. Normal EF   Stroke (HCC) 11/18/2017   Acute/subacute left pontine CVA/notes 11/18/2017   Type II diabetes mellitus (HCC)    Past Surgical History:  Procedure Laterality Date   CORONARY STENT INTERVENTION N/A 07/30/2017   Procedure: CORONARY STENT INTERVENTION;  Surgeon: Claudene Victory ORN, MD;  Location: [facility] INVASIVE CV LAB;  Service: Cardiovascular;  Laterality: N/A;   CORONARY STENT INTERVENTION N/A 08/01/2017   Procedure: CORONARY STENT INTERVENTION;  Surgeon: Anner Alm ORN, MD;  Location: [facility] INVASIVE CV LAB;  Service: Cardiovascular;  Laterality: N/A;   CORONARY/GRAFT ACUTE MI REVASCULARIZATION N/A 07/30/2017   Procedure: Coronary/Graft Acute MI Revascularization;  Surgeon: Claudene Victory ORN, MD;  Location: [facility] INVASIVE CV LAB;  Service: Cardiovascular;  Laterality: N/A;   INGUINAL HERNIA REPAIR Bilateral    KNEE ARTHROSCOPY Right    LEFT HEART CATH AND CORONARY ANGIOGRAPHY N/A 07/30/2017   Procedure: LEFT HEART CATH AND CORONARY ANGIOGRAPHY;  Surgeon: Claudene Victory ORN, MD;  Location: [facility] INVASIVE CV LAB;  Service: Cardiovascular;  Laterality: N/A;   LEFT HEART CATH AND CORONARY ANGIOGRAPHY N/A 08/01/2017   Procedure: LEFT HEART CATH AND CORONARY ANGIOGRAPHY;   Surgeon: Anner Alm ORN, MD;  Location: [facility] INVASIVE CV LAB;  Service: Cardiovascular;  Laterality: N/A;   STRABISMUS SURGERY Left 11/24/2018   Procedure: STRABISMUS REPAIR LEFT EYE;  Surgeon: Neysa Fallow, MD;  Location: [facility];  Service: Ophthalmology;  Laterality: Left;    Allergies  Allergies  Allergen Reactions   Bee Venom Other (See Comments)    Hay Fever     Labs/Other Studies Reviewed    The following studies were reviewed today:  Cardiac Studies & Procedures   ______________________________________________________________________________________________ CARDIAC CATHETERIZATION  CARDIAC CATHETERIZATION 08/01/2017  Conclusion Images from the original result were not included.   Lesion #1: Prox RCA 90% stenosed  A drug-eluting stent was successfully placed using a STENT SYNERGY DES 3.5X16. --> Postdilated to 4.1 mm  Post intervention, there is a 0% residual stenosis.  Prox Cx to Mid Cx lesion is 95% stenosed.  2 Overlapping Drug-Eluting Stents were successfully placed using a STENT SYNERGY DES 3.5X16 with an overlapping STENT SYNERGY DES 3.5X12. - post-dilated to 4.1 mm  Post intervention, there is a 0% residual stenosis.  Patent LAD stent with stable Left Main and proximal LAD as well as OM 2 disease. Also distal RCA 50% lesion is stable as well.  LV end diastolic pressure is normal.  Successful 2 vessel DES PCI -of proximal RCA and Prox-Mid Cx.  The patient was given supplemental glycerin post PCI for mild chest pain. Mild oozing from the sheath insertion site.  Plan: Transfer to 6 C Post-Procedure Unit  Continue DAPT. Continue RF modification per Dr. Claudene.  Anticipate that he should be ready for discharge as early as tomorrow.  Alm Clay, MD Alm Clay, M.D., M.S. Interventional Cardiologist  Pager # [phone] Phone # [phone] [address] [place], [place] [address]  Findings Coronary  Findings Diagnostic  Dominance: Right  Left Main Mid LM lesion is 25% stenosed.  Left Anterior Descending Collaterals Dist LAD filled by collaterals from RPDA.  Prox LAD to Mid LAD lesion is 10% stenosed. The lesion was previously treated.  First Septal Branch  Second Septal Branch Collaterals 2nd Sept filled by collaterals from Inf Sept.  Left Circumflex Vessel is large. Prox Cx to Mid Cx lesion is 95% stenosed. The lesion is located at the bend, eccentric, irregular and ulcerative.  First Obtuse Marginal Branch Vessel is small in size.  Second Obtuse Marginal Branch Ost 2nd Mrg lesion is 70% stenosed.  Third Obtuse Marginal Branch Vessel is large in size.  Right Coronary Artery Prox RCA lesion is 90% stenosed. The lesion is located proximal to the major branch, eccentric and irregular. Mid RCA lesion is 50% stenosed.  Intervention  Prox Cx to Mid Cx lesion Stent Lesion length:  20 mm. Lesion crossed with guidewire using a WIRE MARVEL STR TIP 190CM. Pre-stent angioplasty was performed using a BALLOON SAPPHIRE 2.5X12. Maximum pressure:  16 atm. Inflation time:  30 sec. A drug-eluting stent was successfully placed using a STENT SYNERGY DES 3.5X16. Maximum pressure: 14 atm. Inflation time: 30 sec. Minimum lumen area:  4.1 mm. Stent strut is well apposed. Post-stent angioplasty was performed using a BALLOON SAPPHIRE [place] 4.0X8. Maximum pressure:  16 atm. Inflation time:  30 sec. After post dilation, there was an apparent obstructing hazy almost robotic appearing segment of about 8 mm --this cleared somewhat with IC nitroglycerin  200 mg, however still looked irregular.  I decided to cover this with additional stent. Stent Lesion length:  20 mm. Lesion crossed with guidewire. A drug-eluting stent was successfully placed using a STENT SYNERGY DES 3.5X12. Maximum pressure: 16 atm. Inflation time: 30 sec. Minimum lumen area:  4.1 mm. Stent strut is well apposed. Stent overlaps  previously placed stent. Post-stent angioplasty was performed using a BALLOON SAPPHIRE [place] 4.0X8. Maximum pressure:  16 atm. Inflation time:  20 sec. Following an additional intracoronary nitroglycerin  injection, disclose noted.  There actually was a very small branch vessel apparent that came off from the original lesion site. Post-Intervention Lesion Assessment The intervention was successful. Pre-interventional TIMI flow is 3. Post-intervention TIMI flow is 3. Treated lesion length:  22 mm. No complications occurred at this lesion. Total lesion size cover was 22 mm There is a 0% residual stenosis post intervention.  Prox RCA lesion Stent Lesion length:  14 mm. Lesion crossed with guidewire using a WIRE MARVEL STR TIP 190CM. Pre-stent angioplasty was performed using a BALLOON SAPPHIRE 2.5X12. Maximum pressure:  12 atm. Inflation time:  20 sec. A drug-eluting stent was successfully placed using a STENT SYNERGY DES 3.5X16. Maximum pressure: 16 atm. Inflation time: 30 sec. Minimum lumen area:  4 mm. Stent strut is well apposed. Post-stent angioplasty was performed using a BALLOON SAPPHIRE [place] 4.0X8. Maximum pressure:  16 atm. Inflation time:  20 sec. Post-Intervention Lesion Assessment The intervention was successful. Intentional subintimal strategy was not used. Pre-interventional TIMI flow is 3. Post-intervention TIMI flow is 3. Treated lesion length:  16 mm. No complications occurred at this lesion. There is a 0% residual stenosis post intervention.  CARDIAC CATHETERIZATION 07/30/2017  Conclusion  Technically difficult procedure due to tortuosity in the right innominate artery preventing catheter control especially when attempting to engage the right coronary.  This led to crossover to femoral approach.  These difficulties prolong the time to reperfusion.  Severe three-vessel coronary disease with 80-90% proximal RCA stenosis.  RCA is supplying collaterals to the LAD.  25% distal left main  narrowing.  95% stenosis in the proximal to mid circumflex within a tortuous segment.  Total occlusion of the mid LAD collateralized from the right coronary with thrombus staining.  The lesion is felt to represent the culprit for the patient's presentation.  The LAD was successfully treated with angioplasty and stenting using a 28 x 2.75 mm Synergy stent reducing the 100% stenosis to less than 10% with TIMI grade III flow.  Reperfusion arrhythmias and transient increase in chest discomfort was noted.  Normal left ventricular end-diastolic pressure.  Left ventricular function was not assessed.  RECOMMENDATIONS:   2D Doppler echocardiogram to fully assess LV function and aortic root size (suspected to be somewhat dilated based upon experience during the procedure).  Aggressive risk factor modification with high intensity statin therapy, smoking cessation, blood pressure control, and glycemic control.  Staged PCI on the circumflex and right coronary prior to discharge.  This needs to be discussed among the interventional team.  Findings Coronary Findings Diagnostic  Dominance: Right  Left Main Mid LM lesion is 25% stenosed.  Left Anterior Descending Collaterals Dist LAD filled by collaterals from RPDA.  Prox LAD to Mid LAD lesion is 100% stenosed.  First Septal Branch  Second Septal Branch Collaterals 2nd Sept filled by collaterals from Inf Sept.  Left Circumflex Prox Cx to Mid Cx lesion is 95% stenosed.  First Obtuse Marginal Branch Vessel is small in size.  Second Obtuse Marginal Branch Ost 2nd Mrg lesion is 70% stenosed.  Third Obtuse Marginal Branch Vessel is large in size.  Right Coronary Artery Prox RCA lesion is 90% stenosed. Mid RCA lesion is 50% stenosed.  Intervention  Prox LAD to Mid LAD lesion Stent A stent was successfully placed. Post-Intervention Lesion Assessment The intervention was successful. There is a 10% residual stenosis post  intervention.     ECHOCARDIOGRAM  ECHOCARDIOGRAM COMPLETE BUBBLE STUDY 08/06/2022  Narrative ECHOCARDIOGRAM REPORT    Patient Name:   Matthew Walker   Date of Exam: 08/06/2022 Medical Rec #:  [id number]     Height:       70.0 in Accession #:    [id number]    Weight:       170.6 lb Date of Birth:  [birth year]     BSA:          1.951 m Patient Age:    56 years      BP:           130/68 mmHg Patient Gender: M             HR:           75 bpm. Exam Location:  [address]  Procedure: 2D Echo, Cardiac Doppler, Color Doppler, Strain Analysis and Saline Contrast Bubble Study  Indications:    Stroke 434.91 / I63.9  History:        Patient has prior history of Echocardiogram examinations, most recent 11/19/2017. CAD and Previous Myocardial Infarction, Stroke; Risk Factors:Hypertension, GERD, Sleep Apnea and Diabetes.  Sonographer:    Lauraine Pilot RDCS Referring Phys: [id number] ARUN K THUKKANI   Sonographer Comments: Global  longitudinal strain was attempted. IMPRESSIONS   1. Left ventricular ejection fraction, by estimation, is 60 to 65%. The left ventricle has normal function. The left ventricle has no regional wall motion abnormalities. There is mild concentric left ventricular hypertrophy. Left ventricular diastolic parameters are consistent with Grade I diastolic dysfunction (impaired relaxation). The average left ventricular global longitudinal strain is -16.5 %. The global longitudinal strain is normal. 2. Right ventricular systolic function is normal. The right ventricular size is normal. 3. The mitral valve is normal in structure. Trivial mitral valve regurgitation. No evidence of mitral stenosis. 4. The aortic valve is normal in structure. Aortic valve regurgitation is not visualized. No aortic stenosis is present. 5. The aortic root at the SOV measures 3.9cm but when indexed for BSA and gender it is within normal range. There is mild dilatation of the ascending aorta, measuring  45 mm. 6. The inferior vena cava is normal in size with greater than 50% respiratory variability, suggesting right atrial pressure of 3 mmHg. 7. Agitated saline contrast bubble study was negative, with no evidence of any interatrial shunt.  FINDINGS Left Ventricle: Left ventricular ejection fraction, by estimation, is 60 to 65%. The left ventricle has normal function. The left ventricle has no regional wall motion abnormalities. The average left ventricular global longitudinal strain is -16.5 %. The global longitudinal strain is normal. The left ventricular internal cavity size was normal in size. There is mild concentric left ventricular hypertrophy. Left ventricular diastolic parameters are consistent with Grade I diastolic dysfunction (impaired relaxation). Normal left ventricular filling pressure.  Right Ventricle: The right ventricular size is normal. No increase in right ventricular wall thickness. Right ventricular systolic function is normal.  Left Atrium: Left atrial size was normal in size.  Right Atrium: Right atrial size was normal in size.  Pericardium: There is no evidence of pericardial effusion.  Mitral Valve: The mitral valve is normal in structure. Trivial mitral valve regurgitation. No evidence of mitral valve stenosis.  Tricuspid Valve: The tricuspid valve is normal in structure. Tricuspid valve regurgitation is not demonstrated. No evidence of tricuspid stenosis.  Aortic Valve: The aortic valve is normal in structure. Aortic valve regurgitation is not visualized. No aortic stenosis is present.  Pulmonic Valve: The pulmonic valve was normal in structure. Pulmonic valve regurgitation is trivial. No evidence of pulmonic stenosis.  Aorta: The aortic root at the SOV measures 3.9cm but when indexed for BSA and gender it is within normal range. Aortic dilatation noted. There is mild dilatation of the ascending aorta, measuring 45 mm.  Venous: The inferior vena cava is normal  in size with greater than 50% respiratory variability, suggesting right atrial pressure of 3 mmHg.  IAS/Shunts: No atrial level shunt detected by color flow Doppler. Agitated saline contrast was given intravenously to evaluate for intracardiac shunting. Agitated saline contrast bubble study was negative, with no evidence of any interatrial shunt.   LEFT VENTRICLE PLAX 2D LVIDd:         4.30 cm     Diastology LVIDs:         2.70 cm     LV e' medial:    5.59 cm/s LV PW:         1.20 cm     LV E/e' medial:  12.6 LV IVS:        1.20 cm     LV e' lateral:   7.70 cm/s LVOT diam:     2.10 cm     LV E/e' lateral:  9.1 LV SV:         70 LV SV Index:   36          2D Longitudinal Strain LVOT Area:     3.46 cm    2D Strain GLS (A2C):   -18.3 % 2D Strain GLS (A3C):   -15.7 % 2D Strain GLS (A4C):   -15.5 % LV Volumes (MOD)           2D Strain GLS Avg:     -16.5 % LV vol d, MOD A2C: 96.2 ml LV vol d, MOD A4C: 94.8 ml LV vol s, MOD A2C: 42.4 ml LV vol s, MOD A4C: 39.0 ml LV SV MOD A2C:     53.8 ml LV SV MOD A4C:     94.8 ml LV SV MOD BP:      55.1 ml  RIGHT VENTRICLE RV S prime:     10.60 cm/s TAPSE (M-mode): 2.0 cm  LEFT ATRIUM             Index        RIGHT ATRIUM           Index LA diam:        4.10 cm 2.10 cm/m   RA Area:     10.30 cm LA Vol (A2C):   23.4 ml 11.99 ml/m  RA Volume:   17.70 ml  9.07 ml/m LA Vol (A4C):   24.8 ml 12.71 ml/m LA Biplane Vol: 25.2 ml 12.92 ml/m AORTIC VALVE LVOT Vmax:   101.00 cm/s LVOT Vmean:  67.400 cm/s LVOT VTI:    0.201 m  AORTA Ao Root diam: 3.90 cm Ao Asc diam:  4.30 cm  MITRAL VALVE MV Area (PHT): 3.27 cm    SHUNTS MV Decel Time: 232 msec    Systemic VTI:  0.20 m MV E velocity: 70.20 cm/s  Systemic Diam: 2.10 cm MV A velocity: 86.40 cm/s MV E/A ratio:  0.81  Wilbert Bihari MD Electronically signed by Wilbert Bihari MD Signature Date/Time: 08/06/2022/3:25:21 PM    Final           ______________________________________________________________________________________________     Recent Labs: No results found for requested labs within last 365 days.  Recent Lipid Panel    Component Value Date/Time   CHOL 146 07/22/2022 1059   TRIG 102 07/22/2022 1059   HDL 45 07/22/2022 1059   CHOLHDL 3.2 07/22/2022 1059   CHOLHDL 4.2 11/19/2017 0407   VLDL 55 (H) 11/19/2017 0407   LDLCALC 82 07/22/2022 1059    History of Present Illness    58 year old male with the above past medical history including CAD s/p DES-mLAD, staged PCI/DES-pRCA, DES-p-mLCx in 2019, dilation of ascending aorta, hypertension, hyperlipidemia, CVA, type 2 diabetes, OSA, and GERD.  Previously followed by Dr. Claudene, he later established with Dr. Wendel. He was hospitalized in April 2019 in the setting of STEMI. He underwent DES-DES-mLAD, staged PCI/DES-pRCA, DES-p-mLCx.  He was hospitalized in 10/2017 in the setting of CVA (pontine infarct).  He was last seen in office on 07/12/2022 and was stable from a cardiac standpoint.  He denies symptoms concerning for angina.  Beta-blocker was discontinued.  He was transitioned from DAPT to Plavix  monotherapy indefinitely.  He was transitioned from lisinopril -HCTZ combination pill to lisinopril  40 mg daily and hydrochlorothiazide  25 mg daily.  Repeat echocardiogram in 07/2022 with bubble study showed EF 60 to 65%, normal LV systolic function, no RWMA, mild concentric LVH, G1 DD, normal RV systolic function, no significant  valvular abnormalities, mild dilation of ascending aorta measuring 45 mm, negative bubble study.  He presents today for follow-up accompanied by his son. Since his last visit he has done well from a cardiac standpoint.  He denies any symptoms concerning for angina. Overall, he reports feeling well.  Home Medications    Current Outpatient Medications  Medication Sig Dispense Refill   amLODipine  (NORVASC ) 10 MG tablet Take 10 mg by mouth daily.      atorvastatin  (LIPITOR ) 80 MG tablet TAKE ONE TABLET BY MOUTH DAILY AT 6 PM. 90 tablet 2   clopidogrel  (PLAVIX ) 75 MG tablet Take 1 tablet (75 mg total) by mouth daily. 30 tablet 0   ezetimibe  (ZETIA ) 10 MG tablet Take 1 tablet (10 mg total) by mouth daily. KEEP OV. 90 tablet 0   fluticasone (FLONASE) 50 MCG/ACT nasal spray Place 2 sprays into both nostrils daily.     glipiZIDE  (GLUCOTROL ) 5 MG tablet Take 1 tablet (5 mg total) by mouth 2 (two) times daily. 60 tablet 11   hydrochlorothiazide  (HYDRODIURIL ) 25 MG tablet Take 1 tablet (25 mg total) by mouth daily. 30 tablet 0   lisinopril  (ZESTRIL ) 40 MG tablet Take 1 tablet (40 mg total) by mouth daily. 30 tablet 0   metFORMIN (GLUCOPHAGE-XR) 500 MG 24 hr tablet Take 500 mg by mouth in the morning, at noon, in the evening, and at bedtime.  3   Multiple Vitamins-Minerals (EMERGEN-C IMMUNE PO) Take 1 packet by mouth daily.     nitroGLYCERIN  (NITROSTAT ) 0.4 MG SL tablet Place 1 tablet (0.4 mg total) under the tongue every 5 (five) minutes as needed for chest pain. 25 tablet 3   sodium chloride  (OCEAN) 0.65 % SOLN nasal spray Place 1 spray into both nostrils as needed for congestion.     traMADol (ULTRAM) 50 MG tablet Take 50 mg by mouth 4 (four) times daily as needed.     FARXIGA 10 MG TABS tablet Take 10 mg by mouth daily. (Patient not taking: Reported on 12/29/2023)     No current facility-administered medications for this visit.     Review of Systems    He denies chest pain, palpitations, dyspnea, pnd, orthopnea, n, v, dizziness, syncope, edema, weight gain, or early satiety. All other systems reviewed and are otherwise negative except as noted above.   Physical Exam    VS:  BP 125/79   Pulse 75   Ht 5' 10 (1.778 m)   Wt 175 lb (79.4 kg)   SpO2 95%   BMI 25.11 kg/m   GEN: Well nourished, well developed, in no acute distress. HEENT: normal. Neck: Supple, no JVD, carotid bruits, or masses. Cardiac: RRR, no murmurs, rubs, or gallops. No  clubbing, cyanosis, edema.  Radials/DP/PT 2+ and equal bilaterally.  Respiratory:  Respirations regular and unlabored, clear to auscultation bilaterally. GI: Soft, nontender, nondistended, BS + x 4. MS: no deformity or atrophy. Skin: warm and dry, no rash. Neuro:  Strength and sensation are intact. Psych: Normal affect.  Accessory Clinical Findings    ECG personally reviewed by me today - EKG Interpretation Date/Time:  Thursday December 29 2023 15:06:06 EDT Ventricular Rate:  75 PR Interval:  166 QRS Duration:  106 QT Interval:  384 QTC Calculation: 428 R Axis:   -71  Text Interpretation: Normal sinus rhythm Left axis deviation Septal infarct (cited on or before 25-Nov-2017) Inferior infarct (cited on or before 25-Nov-2017) When compared with ECG of 25-Nov-2017 10:38, No significant change was found Confirmed by Daneen,  Damien ([id number]) on 12/29/2023 3:39:23 PM  - no acute changes.   Lab Results  Component Value Date   WBC 13.2 (H) 12/16/2021   HGB 15.6 12/16/2021   HCT 46.0 12/16/2021   MCV 89.1 12/16/2021   PLT 148 (L) 12/16/2021   Lab Results  Component Value Date   CREATININE 0.78 07/22/2022   BUN 16 07/22/2022   NA 145 (H) 07/22/2022   K 4.2 07/22/2022   CL 104 07/22/2022   CO2 21 07/22/2022   Lab Results  Component Value Date   ALT 23 07/22/2022   AST 14 07/22/2022   ALKPHOS 100 07/22/2022   BILITOT 1.2 07/22/2022   Lab Results  Component Value Date   CHOL 146 07/22/2022   HDL 45 07/22/2022   LDLCALC 82 07/22/2022   TRIG 102 07/22/2022   CHOLHDL 3.2 07/22/2022    Lab Results  Component Value Date   HGBA1C 10.2 (H) 11/19/2017    Assessment & Plan    1. CAD: S/p DES-mLAD, staged PCI/DES-pRCA, DES-p-mLCx in 2019. Repeat echocardiogram in 07/2022 with bubble study showed EF 60 to 65%, normal LV systolic function, no RWMA, mild concentric LVH, G1 DD, normal RV systolic function, no significant valvular abnormalities, mild dilation of ascending aorta measuring 45  mm, negative bubble study.  Stable with no anginal symptoms. No indication for ischemic evaluation.  Continue Plavix , amlodipine , lisinopril , hydrochlorothiazide , Lipitor , and Zetia . He was previously on Farxiga, however, he was told his insurance would not longer cover this medication. I will reach out to our pharmacy team to see if there would be any possibility for assistance, or if Jardiance would be a more affordable option.  Beta-blocker was discontinued at last office visit in the setting of normal EF.  2. Dilation of ascending aorta: Echocardiogram in 07/2022 showed mild dilation of the ascending order measuring 45 mm.  Will repeat CT chest/aorta for monitoring.  He will have a BMET with his PCP next week.  3. Hypertension: BP well controlled. Continue current antihypertensive regimen.   4. Hyperlipidemia: LDL was 82 in 06/2022. Continue Lipitor , Zetia .  5. History of CVA: No recurrence.  Bubble study was negative in 07/2022.  Continue Plavix , Lipitor .  6. Type 2 diabetes: A1c was 9.2 in 04/2023.  Monitored and managed per PCP.  7. Disposition: Follow-up in 1 year, sooner if needed.      Damien JAYSON Braver, NP 12/29/2023, 3:39 PM

## 2023-12-29 NOTE — Patient Instructions (Addendum)
 Medication Instructions:  Your physician recommends that you continue on your current medications as directed. Please refer to the Current Medication list given to you today.  *If you need a refill on your cardiac medications before your next appointment, please call your pharmacy*  Lab Work: NONE ordered at this time of appointment   Testing/Procedures: CT Chest Aorta  Follow-Up: At [facility], you and your health needs are our priority.  As part of our continuing mission to provide you with exceptional heart care, our providers are all part of one team.  This team includes your primary Cardiologist (physician) and Advanced Practice Providers or APPs (Physician Assistants and Nurse Practitioners) who all work together to provide you with the care you need, when you need it.  Your next appointment:   1 year(s)  Provider:   Arun K Thukkani, MD    We recommend signing up for the patient portal called MyChart.  Sign up information is provided on this After Visit Summary.  MyChart is used to connect with patients for Virtual Visits (Telemedicine).  Patients are able to view lab/test results, encounter notes, upcoming appointments, etc.  Non-urgent messages can be sent to your provider as well.   To learn more about what you can do with MyChart, go to [email or web address].   Other Instructions   Please have a BMET lab work completed with your Primary Care Physician

## 2023-12-30 ENCOUNTER — Other Ambulatory Visit (HOSPITAL_COMMUNITY): Payer: Self-pay

## 2023-12-30 ENCOUNTER — Telehealth: Payer: Self-pay

## 2023-12-30 NOTE — Telephone Encounter (Signed)
 Pharmacy Patient Advocate Encounter   Received notification from Physician's Office that prior authorization for FARXIGA is required/requested.   Insurance verification completed.   The patient is insured through [facility] .   Per test claim:  JARDIANCE is preferred by the insurance.  If suggested medication is appropriate, Please send in a new RX and discontinue this one. If not, please advise as to why it's not appropriate so that we may request a Prior Authorization. Please note, some preferred medications may still require a PA.  If the suggested medications have not been trialed and there are no contraindications to their use, the PA will not be submitted, as it will not be approved.

## 2023-12-30 NOTE — Telephone Encounter (Signed)
-----   Message from Damien JAYSON Braver sent at 12/29/2023  4:51 PM EDT ----- Regarding: Doreen insurance coverage Patient seen in clinic today. He states his insurance stopped covering [place]. Are there any opportunities for assistance or would Jardiance be an affordable alternative?  I appreciate your help.   Damien Braver, NP

## 2024-01-02 ENCOUNTER — Other Ambulatory Visit: Payer: Self-pay

## 2024-01-02 DIAGNOSIS — I10 Essential (primary) hypertension: Secondary | ICD-10-CM

## 2024-01-02 DIAGNOSIS — Z79899 Other long term (current) drug therapy: Secondary | ICD-10-CM

## 2024-01-02 DIAGNOSIS — I2510 Atherosclerotic heart disease of native coronary artery without angina pectoris: Secondary | ICD-10-CM

## 2024-01-02 MED ORDER — EMPAGLIFLOZIN 10 MG PO TABS
10.0000 mg | ORAL_TABLET | Freq: Every day | ORAL | 3 refills | Status: AC
Start: 2024-01-02 — End: ?

## 2024-01-02 NOTE — Telephone Encounter (Signed)
 Spoke with pt. Pt is aware that Jardiance  10 mg. is covered under his insurance. Pt will complete lab work in 2 weeks after being on Jrdiance. Pt voiced understanding. Medication sent to pts pharmacy and Farxiga will be canceled.

## 2024-01-12 ENCOUNTER — Ambulatory Visit (HOSPITAL_COMMUNITY)
Admission: RE | Admit: 2024-01-12 | Discharge: 2024-01-12 | Disposition: A | Discharge status: Home / Self Care | Source: Ambulatory Visit | Attending: Internal Medicine | Admitting: Internal Medicine

## 2024-01-12 DIAGNOSIS — I7121 Aneurysm of the ascending aorta, without rupture: Secondary | ICD-10-CM | POA: Diagnosis not present

## 2024-01-12 DIAGNOSIS — I77810 Thoracic aortic ectasia: Secondary | ICD-10-CM | POA: Diagnosis present

## 2024-01-12 LAB — POCT I-STAT CREATININE: Creatinine, Ser: 0.7 mg/dL (ref 0.61–1.24)

## 2024-01-12 MED ORDER — IOHEXOL 350 MG/ML SOLN
75.0000 mL | Freq: Once | INTRAVENOUS | Status: AC | PRN
Start: 2024-01-12 — End: 2024-01-12
  Administered 2024-01-12: 75 mL via INTRAVENOUS

## 2024-01-20 ENCOUNTER — Ambulatory Visit: Payer: Self-pay | Admitting: Nurse Practitioner

## 2024-01-23 ENCOUNTER — Ambulatory Visit: Payer: Self-pay
